# Patient Record
Sex: Female | Born: 1950 | Race: White | Hispanic: No | Marital: Married | Smoking: Never smoker
Health system: Southern US, Community
[De-identification: ages and names within clinical notes are randomized; demographics above are authoritative.]

## PROBLEM LIST (undated history)

## (undated) DIAGNOSIS — D649 Anemia, unspecified: Secondary | ICD-10-CM

## (undated) DIAGNOSIS — I10 Essential (primary) hypertension: Secondary | ICD-10-CM

## (undated) DIAGNOSIS — K219 Gastro-esophageal reflux disease without esophagitis: Secondary | ICD-10-CM

## (undated) DIAGNOSIS — N201 Calculus of ureter: Secondary | ICD-10-CM

## (undated) DIAGNOSIS — I2510 Atherosclerotic heart disease of native coronary artery without angina pectoris: Secondary | ICD-10-CM

## (undated) DIAGNOSIS — K3580 Unspecified acute appendicitis: Secondary | ICD-10-CM

## (undated) DIAGNOSIS — E785 Hyperlipidemia, unspecified: Secondary | ICD-10-CM

## (undated) DIAGNOSIS — Z973 Presence of spectacles and contact lenses: Secondary | ICD-10-CM

## (undated) DIAGNOSIS — E079 Disorder of thyroid, unspecified: Secondary | ICD-10-CM

## (undated) DIAGNOSIS — T7840XA Allergy, unspecified, initial encounter: Secondary | ICD-10-CM

## (undated) HISTORY — DX: Hyperlipidemia, unspecified: E78.5

## (undated) HISTORY — DX: Anemia, unspecified: D64.9

## (undated) HISTORY — DX: Gastro-esophageal reflux disease without esophagitis: K21.9

## (undated) HISTORY — DX: Atherosclerotic heart disease of native coronary artery without angina pectoris: I25.10

## (undated) HISTORY — DX: Disorder of thyroid, unspecified: E07.9

## (undated) HISTORY — DX: Allergy, unspecified, initial encounter: T78.40XA

## (undated) HISTORY — PX: APPENDECTOMY: SHX54

---
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
## 2016-06-11 ENCOUNTER — Ambulatory Visit (INDEPENDENT_AMBULATORY_CARE_PROVIDER_SITE_OTHER): Payer: Medicare Other | Admitting: Internal Medicine

## 2016-06-11 ENCOUNTER — Encounter: Payer: Self-pay | Admitting: Internal Medicine

## 2016-06-11 VITALS — BP 182/82 | HR 65 | Temp 98.8°F | Wt 168.4 lb

## 2016-06-11 DIAGNOSIS — R5383 Other fatigue: Secondary | ICD-10-CM | POA: Insufficient documentation

## 2016-06-11 DIAGNOSIS — R5382 Chronic fatigue, unspecified: Secondary | ICD-10-CM | POA: Diagnosis present

## 2016-06-11 DIAGNOSIS — R030 Elevated blood-pressure reading, without diagnosis of hypertension: Secondary | ICD-10-CM

## 2016-06-11 DIAGNOSIS — I10 Essential (primary) hypertension: Secondary | ICD-10-CM

## 2016-06-11 NOTE — Assessment & Plan Note (Signed)
Initial 182/82, repeat 154/78 after sitting for ~15 minutes. Past dx of HTN 10 years ago prior to significant weight loss. Will begin trial of dietary changes then re-evaluate.  - Provided information regarding DASH diet - Encouraged continued weight loss efforts and exercise - F/u in 6 weeks. If still hypertensive at that time, will consider beginning medication.  - BMP today

## 2016-06-11 NOTE — Progress Notes (Signed)
66 y.o. year old female presents to establish care.  Acute Concerns: Weight: Has gained 40 pounds in past year. Lost 140 pounds 10 years ago and is concerned about regaining weight. Has been working to lose weight, and is a member of [employer]. Exercises 3 days a week with her husband by working at the gym. Is also going to start weight lifting to increase primarily upper body strength.   Elevated BP Patient was diagnosed with hypertension 10 years ago. After she lost 140 pounds, her BP normalized and she was able to stop taking medication. She has not taken medication since that time.   Fatigue Reports frequent fatigue, though thinks this is in part to weight gain. Said she generally felt much better before she gained 40 pounds, which is primary reason why she is trying to lose weight again.   Diet: Well-balanced.   Exercise: Walking 3 days a week.   Sexual/Birth History: [id number] (son died at 55 months of age. Has two living daughters). All vaginal deliveries with only complication of gestational HTN with first pregnancy.   Birth Control: Post-menopausal  Social:  Social History   Social History  . Marital status: Married    Spouse name: Carloyn Manner  . Number of children: N/A  . Years of education: N/A   Occupational History  . retired    Social History Main Topics  . Smoking status: Never Smoker  . Smokeless tobacco: Never Used  . Alcohol use No  . Drug use: No  . Sexual activity: Not Asked   Other Topics Concern  . None   Social History Narrative  . None  No past medical history on file. No Known Allergies History reviewed. No pertinent surgical history.   Immunization:  There is no immunization history on file for this patient.  Cancer Screening:  Pap Smear: 2016  Mammogram: 2016  Colonoscopy: 2013  Physical Exam: VITALS: Reviewed GEN: Pleasant female, NAD HEENT: Normocephalic, PERRL, EOMI, no scleral icterus, bilateral TM pearly  grey, nasal septum midline, MMM, uvula midline, no anterior or posterior lymphadenopathy, no thyromegaly CARDIAC:RRR, S1 and S2 present, no murmur, no heaves/thrills RESP: CTAB, normal effort ABD: soft, no tenderness, normal bowel sounds EXT: No edema, 2+ radial and DP pulses SKIN: no rash  ASSESSMENT & PLAN: 66 y.o. female presents for annual well woman/preventative exam and GYN exam. Please see problem specific assessment and plan.   Elevated BP without diagnosis of hypertension Initial 182/82, repeat 154/78 after sitting for ~15 minutes. Past dx of HTN 10 years ago prior to significant weight loss. Will begin trial of dietary changes then re-evaluate.  - Provided information regarding DASH diet - Encouraged continued weight loss efforts and exercise - F/u in 6 weeks. If still hypertensive at that time, will consider beginning medication.  - BMP today  Fatigue - TSH and CBC today - Will call patient if any abnormal results - F/u in six weeks  Adin Hector, MD, MPH PGY-2 [facility] Pager [phone]

## 2016-06-11 NOTE — Patient Instructions (Signed)
It was nice meeting you today Maria Wall!  Today we checked your thyroid hormone level, your blood count, and you electrolytes (sodium, potassium, etc). I will call you if there are any abnormal results.   Your blood pressure was elevated today. We will begin a trial of dietary changes to try to bring it down. If it is still elevated at your follow-up appointment, we will discuss beginning medication.   I will see you back in six weeks to recheck your blood pressure.   If you have any questions or concerns in the meantime, please feel free to call the clinic.   Be well,  Dr. Avon Gully   DASH Eating Plan DASH stands for "Dietary Approaches to Stop Hypertension." The DASH eating plan is a healthy eating plan that has been shown to reduce high blood pressure (hypertension). It may also reduce your risk for type 2 diabetes, heart disease, and stroke. The DASH eating plan may also help with weight loss. What are tips for following this plan? General guidelines   Avoid eating more than 2,300 mg (milligrams) of salt (sodium) a day. If you have hypertension, you may need to reduce your sodium intake to 1,500 mg a day.  Limit alcohol intake to no more than 1 drink a day for nonpregnant women and 2 drinks a day for men. One drink equals 12 oz of beer, 5 oz of wine, or 1 oz of hard liquor.  Work with your health care provider to maintain a healthy body weight or to lose weight. Ask what an ideal weight is for you.  Get at least 30 minutes of exercise that causes your heart to beat faster (aerobic exercise) most days of the week. Activities may include walking, swimming, or biking.  Work with your health care provider or diet and nutrition specialist (dietitian) to adjust your eating plan to your individual calorie needs. Reading food labels   Check food labels for the amount of sodium per serving. Choose foods with less than 5 percent of the Daily Value of sodium. Generally, foods with less than  300 mg of sodium per serving fit into this eating plan.  To find whole grains, look for the word "whole" as the first word in the ingredient list. Shopping   Buy products labeled as "low-sodium" or "no salt added."  Buy fresh foods. Avoid canned foods and premade or frozen meals. Cooking   Avoid adding salt when cooking. Use salt-free seasonings or herbs instead of table salt or sea salt. Check with your health care provider or pharmacist before using salt substitutes.  Do not fry foods. Cook foods using healthy methods such as baking, boiling, grilling, and broiling instead.  Cook with heart-healthy oils, such as olive, canola, soybean, or sunflower oil. Meal planning    Eat a balanced diet that includes:  5 or more servings of fruits and vegetables each day. At each meal, try to fill half of your plate with fruits and vegetables.  Up to 6-8 servings of whole grains each day.  Less than 6 oz of lean meat, poultry, or fish each day. A 3-oz serving of meat is about the same size as a deck of cards. One egg equals 1 oz.  2 servings of low-fat dairy each day.  A serving of nuts, seeds, or beans 5 times each week.  Heart-healthy fats. Healthy fats called Omega-3 fatty acids are found in foods such as flaxseeds and coldwater fish, like sardines, salmon, and mackerel.  Limit how  much you eat of the following:  Canned or prepackaged foods.  Food that is high in trans fat, such as fried foods.  Food that is high in saturated fat, such as fatty meat.  Sweets, desserts, sugary drinks, and other foods with added sugar.  Full-fat dairy products.  Do not salt foods before eating.  Try to eat at least 2 vegetarian meals each week.  Eat more home-cooked food and less restaurant, buffet, and fast food.  When eating at a restaurant, ask that your food be prepared with less salt or no salt, if possible. What foods are recommended? The items listed may not be a complete list. Talk  with your dietitian about what dietary choices are best for you. Grains  Whole-grain or whole-wheat bread. Whole-grain or whole-wheat pasta. Brown rice. [place] Morrow. Bulgur. Whole-grain and low-sodium cereals. Pita bread. Low-fat, low-sodium crackers. Whole-wheat flour tortillas. Vegetables  Fresh or frozen vegetables (raw, steamed, roasted, or grilled). Low-sodium or reduced-sodium tomato and vegetable juice. Low-sodium or reduced-sodium tomato sauce and tomato paste. Low-sodium or reduced-sodium canned vegetables. Fruits  All fresh, dried, or frozen fruit. Canned fruit in natural juice (without added sugar). Meat and other protein foods  Skinless chicken or [place]. Ground chicken or [place]. Pork with fat trimmed off. Fish and seafood. Egg whites. Dried beans, peas, or lentils. Unsalted nuts, nut butters, and seeds. Unsalted canned beans. Lean cuts of beef with fat trimmed off. Low-sodium, lean deli meat. Dairy  Low-fat (1%) or fat-free (skim) milk. Fat-free, low-fat, or reduced-fat cheeses. Nonfat, low-sodium ricotta or cottage cheese. Low-fat or nonfat yogurt. Low-fat, low-sodium cheese. Fats and oils  Soft margarine without trans fats. Vegetable oil. Low-fat, reduced-fat, or light mayonnaise and salad dressings (reduced-sodium). Canola, safflower, olive, soybean, and sunflower oils. Avocado. Seasoning and other foods  Herbs. Spices. Seasoning mixes without salt. Unsalted popcorn and pretzels. Fat-free sweets. What foods are not recommended? The items listed may not be a complete list. Talk with your dietitian about what dietary choices are best for you. Grains  Baked goods made with fat, such as croissants, muffins, or some breads. Dry pasta or rice meal packs. Vegetables  Creamed or fried vegetables. Vegetables in a cheese sauce. Regular canned vegetables (not low-sodium or reduced-sodium). Regular canned tomato sauce and paste (not low-sodium or reduced-sodium). Regular tomato and  vegetable juice (not low-sodium or reduced-sodium). [name]. Olives. Fruits  Canned fruit in a light or heavy syrup. Fried fruit. Fruit in cream or butter sauce. Meat and other protein foods  Fatty cuts of meat. Ribs. Fried meat. Berniece Salines. Sausage. Bologna and other processed lunch meats. Salami. Fatback. Hotdogs. Bratwurst. Salted nuts and seeds. Canned beans with added salt. Canned or smoked fish. Whole eggs or egg yolks. Chicken or [place] with skin. Dairy  Whole or 2% milk, cream, and half-and-half. Whole or full-fat cream cheese. Whole-fat or sweetened yogurt. Full-fat cheese. Nondairy creamers. Whipped toppings. Processed cheese and cheese spreads. Fats and oils  Butter. Stick margarine. Lard. Shortening. Ghee. Bacon fat. Tropical oils, such as coconut, palm kernel, or palm oil. Seasoning and other foods  Salted popcorn and pretzels. Onion salt, garlic salt, seasoned salt, table salt, and sea salt. Worcestershire sauce. Tartar sauce. Barbecue sauce. Teriyaki sauce. Soy sauce, including reduced-sodium. Steak sauce. Canned and packaged gravies. Fish sauce. Oyster sauce. Cocktail sauce. Horseradish that you find on the shelf. Ketchup. Mustard. Meat flavorings and tenderizers. Bouillon cubes. Hot sauce and Tabasco sauce. Premade or packaged marinades. Premade or packaged taco seasonings. Relishes. Regular salad  dressings. Where to find more information:  National Heart, Lung, and [place]: https://wilson-eaton.com/  American Heart Association: www.heart.org Summary  The DASH eating plan is a healthy eating plan that has been shown to reduce high blood pressure (hypertension). It may also reduce your risk for type 2 diabetes, heart disease, and stroke.  With the DASH eating plan, you should limit salt (sodium) intake to 2,300 mg a day. If you have hypertension, you may need to reduce your sodium intake to 1,500 mg a day.  When on the DASH eating plan, aim to eat more fresh fruits and vegetables,  whole grains, lean proteins, low-fat dairy, and heart-healthy fats.  Work with your health care provider or diet and nutrition specialist (dietitian) to adjust your eating plan to your individual calorie needs. This information is not intended to replace advice given to you by your health care provider. Make sure you discuss any questions you have with your health care provider. Document Released: 01/07/2011 Document Revised: 01/12/2016 Document Reviewed: 01/12/2016 Elsevier Interactive Patient Education  2017 [employer].

## 2016-06-11 NOTE — Assessment & Plan Note (Signed)
-   TSH and CBC today - Will call patient if any abnormal results - F/u in six weeks

## 2016-06-12 LAB — BASIC METABOLIC PANEL
BUN / CREAT RATIO: 23 (ref 12–28)
BUN: 14 mg/dL (ref 8–27)
CHLORIDE: 101 mmol/L (ref 96–106)
CO2: 21 mmol/L (ref 18–29)
Calcium: 12.1 mg/dL — ABNORMAL HIGH (ref 8.7–10.3)
Creatinine, Ser: 0.6 mg/dL (ref 0.57–1.00)
GFR calc Af Amer: 110 mL/min/{1.73_m2} (ref >59)
GFR calc non Af Amer: 95 mL/min/{1.73_m2} (ref >59)
Glucose: 86 mg/dL (ref 65–99)
Potassium: 4.3 mmol/L (ref 3.5–5.2)
Sodium: 140 mmol/L (ref 134–144)

## 2016-06-12 LAB — CBC
HEMATOCRIT: 40.6 % (ref 34.0–46.6)
Hemoglobin: 13.5 g/dL (ref 11.1–15.9)
MCH: 29.8 pg (ref 26.6–33.0)
MCHC: 33.3 g/dL (ref 31.5–35.7)
MCV: 90 fL (ref 79–97)
Platelets: 235 10*3/uL (ref 150–379)
RBC: 4.53 x10E6/uL (ref 3.77–5.28)
RDW: 13.8 % (ref 12.3–15.4)
WBC: 5.6 10*3/uL (ref 3.4–10.8)

## 2016-06-12 LAB — TSH: TSH: 1.82 u[IU]/mL (ref 0.450–4.500)

## 2016-07-22 ENCOUNTER — Encounter: Payer: Self-pay | Admitting: Internal Medicine

## 2016-07-22 ENCOUNTER — Ambulatory Visit (INDEPENDENT_AMBULATORY_CARE_PROVIDER_SITE_OTHER): Payer: Medicare Other | Admitting: Internal Medicine

## 2016-07-22 DIAGNOSIS — Z713 Dietary counseling and surveillance: Secondary | ICD-10-CM | POA: Diagnosis not present

## 2016-07-22 DIAGNOSIS — I10 Essential (primary) hypertension: Secondary | ICD-10-CM

## 2016-07-22 DIAGNOSIS — Z0000 Encounter for general adult medical examination without abnormal findings: Secondary | ICD-10-CM | POA: Insufficient documentation

## 2016-07-22 MED ORDER — HYDROCORTISONE 2.5 % EX OINT: TOPICAL_OINTMENT | Freq: Two times a day (BID) | CUTANEOUS | 3 refills | Status: DC

## 2016-07-22 MED ORDER — AMLODIPINE BESYLATE 5 MG PO TABS: 5.0000 mg | ORAL_TABLET | Freq: Every day | ORAL | 0 refills | Status: DC

## 2016-07-22 NOTE — Progress Notes (Signed)
   Subjective:   Patient: Maria Wall       Birthdate: [birth year]       MRN: [id number]      HPI  Maria Wall is a 66 y.o. female presenting for BP f/u as well as concerns about weight.   BP Patient noted to be hypertensive at last visit. Had prior diagnosis of HTN treated with a medication >10 years ago which resolved after patient lost ~40 pounds. She cannot remember which medication she was taking. Has tried to adhere to DASH diet in past six weeks since last visit. Denies headaches or changes in vision.   Weight concerns Patient frustrated that she is unable to lose weight despite healthy diet and exercise. Formerly weighed significantly more, then joined [employer], a 12 step program, and lost ~40 pounds. Is still attending OA meetings. Eats a very regimented well-balanced diet - 4 oz of protein at lunch and dinner, salad with 2 tsp dressing at lunch and dinner, 6 oz fruit daily, breakfast consisting of yogurt, eggs, or oatmeal. Measures all food to ensure correct portion size. Eats out approximately 1-2 times per week. Weight fluctuates between 163-168 pounds. Has been walking about 3 miles 2-3 times per week, but has had to decrease recently due to back pain. Most recently has been walking on treadmill rather than outside without pain. Also lifts weights at gym.   Smoking status reviewed. Patient is never smoker.   Review of Systems See HPI.     Objective:  Physical Exam  Constitutional: She is oriented to person, place, and time and well-developed, well-nourished, and in no distress.  HENT:  Head: Normocephalic and atraumatic.  Mouth/Throat: No oropharyngeal exudate.  Eyes: Conjunctivae and EOM are normal. Right eye exhibits no discharge. Left eye exhibits no discharge.  Pulmonary/Chest: Effort normal. No respiratory distress.  Musculoskeletal: She exhibits no edema or tenderness.  Neurological: She is alert and oriented to person, place, and time.  Skin: Skin is warm and  dry. No rash noted.  Psychiatric: Affect and judgment normal.      Assessment & Plan:  Essential hypertension BP 172/86 initially, 170/80 on recheck about 10 minutes later. No improvement in BP after 6 weeks of DASH diet. Given patient's very regimented diet, doubt dietary changes will make significant enough change to prevent beginning anti-hypertensive. Patient agreeable to starting anti-hypertensive today. BMP checked at last visit and Cr WNL.  - Begin amlodipine 5mg  qd - F/u in 6 weeks. Can increase to 10mg  qd if needed at that time.    Weight loss counseling, encounter for Patient frustrated with lack of weight loss despite exercise and very regimented diet. TSH WNL at last visit, so hypothyroidism not likely etiology. Discussed with Dr. Jenne Campus, who felt that meeting with patient would be beneficial. Provided patient with Dr. Jenne Campus phone number and encouraged to call at earliest convenience.   Healthcare maintenance Patient due for pap and mammogram.  - Provided information for mammogram. Patient to schedule.  - Pap to be performed at next visit in 6 weeks   Adin Hector, MD, MPH PGY-2 [facility] Pager [phone]

## 2016-07-22 NOTE — Assessment & Plan Note (Signed)
Patient frustrated with lack of weight loss despite exercise and very regimented diet. TSH WNL at last visit, so hypothyroidism not likely etiology. Discussed with Dr. Jenne Campus, who felt that meeting with patient would be beneficial. Provided patient with Dr. Jenne Campus phone number and encouraged to call at earliest convenience.

## 2016-07-22 NOTE — Assessment & Plan Note (Signed)
BP 172/86 initially, 170/80 on recheck about 10 minutes later. No improvement in BP after 6 weeks of DASH diet. Given patient's very regimented diet, doubt dietary changes will make significant enough change to prevent beginning anti-hypertensive. Patient agreeable to starting anti-hypertensive today. BMP checked at last visit and Cr WNL.  - Begin amlodipine 5mg  qd - F/u in 6 weeks. Can increase to 10mg  qd if needed at that time.

## 2016-07-22 NOTE — Assessment & Plan Note (Signed)
Patient due for pap and mammogram.  - Provided information for mammogram. Patient to schedule.  - Pap to be performed at next visit in 6 weeks

## 2016-07-22 NOTE — Patient Instructions (Addendum)
It was nice seeing you again today Mrs. Woodlawn!  Please begin taking amlodipine (Norvasc) 5mg  for your blood pressure. Take one tablet daily.   Please call Dr. Jenne Campus, our nutritionist, at your earliest convenience to schedule an appointment to talk about weight loss. Her phone number is [phone].   I will see you back in six weeks to follow-up on your blood pressure and for your pap smear. Please also schedule your mammogram at your earliest convenience.   If you have any questions or concerns, please feel free to call the clinic.   Be well,  Dr. Avon Gully

## 2016-07-26 ENCOUNTER — Other Ambulatory Visit: Payer: Self-pay | Admitting: Internal Medicine

## 2016-07-26 DIAGNOSIS — Z1231 Encounter for screening mammogram for malignant neoplasm of breast: Secondary | ICD-10-CM

## 2016-08-23 ENCOUNTER — Ambulatory Visit: Payer: PRIVATE HEALTH INSURANCE

## 2016-09-03 ENCOUNTER — Encounter: Payer: Self-pay | Admitting: Internal Medicine

## 2016-09-03 ENCOUNTER — Other Ambulatory Visit (HOSPITAL_COMMUNITY)
Admission: RE | Admit: 2016-09-03 | Discharge: 2016-09-03 | Disposition: A | Discharge status: Home / Self Care | Payer: Medicare Other | Source: Ambulatory Visit | Attending: Family Medicine | Admitting: Family Medicine

## 2016-09-03 ENCOUNTER — Ambulatory Visit (INDEPENDENT_AMBULATORY_CARE_PROVIDER_SITE_OTHER): Payer: Medicare Other | Admitting: Internal Medicine

## 2016-09-03 VITALS — BP 124/62 | HR 61 | Temp 98.4°F | Ht 64.5 in | Wt 171.0 lb

## 2016-09-03 DIAGNOSIS — I10 Essential (primary) hypertension: Secondary | ICD-10-CM | POA: Diagnosis present

## 2016-09-03 DIAGNOSIS — Z124 Encounter for screening for malignant neoplasm of cervix: Secondary | ICD-10-CM | POA: Diagnosis present

## 2016-09-03 NOTE — Assessment & Plan Note (Signed)
Has strayed from diet while on vacation this summer, but is eager to return to healthier eating plan. Has appt with Dr. Jenne Campus in about two weeks.  - Keep appt with Dr. Jenne Campus on Aug 16

## 2016-09-03 NOTE — Addendum Note (Signed)
Addended by: Junious Dresser on: 09/03/2016 11:06 AM   Modules accepted: Orders

## 2016-09-03 NOTE — Patient Instructions (Signed)
It was nice seeing you again today Ms. Teton!  I will let you know if there are any abnormalities with your pap smear.   Please continue taking your medications as you have been.   If you have any questions or concerns, please feel free to call the clinic.   Be well,  Dr. Avon Gully

## 2016-09-03 NOTE — Assessment & Plan Note (Addendum)
BP initially elevated at 150/72 when measured directly after arriving to clinic. Improved after pap smear and after sitting in room for a while.  - Continue current treatment regimen of Norvasc 5mg 

## 2016-09-03 NOTE — Progress Notes (Signed)
   Subjective:   Patient: Maria Wall       Birthdate: 09/28/1950       MRN: [id number]      HPI  Maria Wall is a 66 y.o. female presenting for f/u of HTN, weight management, and for pap smear.   HTN Patient started on 5mg  Norvasc at last visit six weeks ago, after her BP did not improve with 6 weeks of DASH diet. Has been taking this daily without reported side effects. Denies HA, changes in vision. Does not check her BP at home.   Weight management Discussed patient's frustration with her weight at last visit. She was to schedule appt with Dr. Jenne Campus. Patient has appt with Dr Jenne Campus on Aug 16. Says that since last visit she hasn't made much improvement in her weight, primarily because she has been on vacation. She has been with her grandchildren and says it is difficult to eat healthy around them. She resumed her regimented eating plan yesterday after getting back from vacation and intends to continue eating healthier. She is looking forward to her appt with Dr. Jenne Campus.   Smoking status reviewed. Patient is never smoker.   Review of Systems See HPI.     Objective:  Physical Exam  Constitutional: She is oriented to person, place, and time and well-developed, well-nourished, and in no distress.  HENT:  Head: Normocephalic and atraumatic.  Eyes: Pupils are equal, round, and reactive to light. Conjunctivae and EOM are normal. Right eye exhibits no discharge. Left eye exhibits no discharge.  Pulmonary/Chest: Effort normal. No respiratory distress.  Genitourinary: Vagina normal, uterus normal, cervix normal, right adnexa normal and left adnexa normal. No vaginal discharge found.  Neurological: She is alert and oriented to person, place, and time.  Skin: Skin is warm and dry.  Psychiatric: Affect and judgment normal.      Assessment & Plan:  Essential hypertension BP initially elevated at 150/72 when measured directly after arriving to clinic. Improved after pap smear and after sitting in  room for a while.  - Continue current treatment regimen of Norvasc 5mg   Weight loss counseling, encounter for Has strayed from diet while on vacation this summer, but is eager to return to healthier eating plan. Has appt with Dr. Jenne Campus in about two weeks.  - Keep appt with Dr. Jenne Campus on Aug 16  Healthcare maintenance Pap smear performed today. Will alert patient of any abnormal results.    Adin Hector, MD, MPH PGY-3 [facility] Pager [phone]

## 2016-09-03 NOTE — Assessment & Plan Note (Signed)
Pap smear performed today. Will alert patient of any abnormal results.

## 2016-09-06 LAB — CYTOLOGY - PAP
DIAGNOSIS: NEGATIVE
HPV: NOT DETECTED

## 2016-09-13 ENCOUNTER — Ambulatory Visit: Payer: PRIVATE HEALTH INSURANCE | Admitting: Family Medicine

## 2016-09-16 ENCOUNTER — Ambulatory Visit (INDEPENDENT_AMBULATORY_CARE_PROVIDER_SITE_OTHER): Payer: Medicare Other | Admitting: Family Medicine

## 2016-09-16 ENCOUNTER — Encounter: Payer: Self-pay | Admitting: Family Medicine

## 2016-09-16 VITALS — Ht 64.5 in | Wt 173.6 lb

## 2016-09-16 DIAGNOSIS — I10 Essential (primary) hypertension: Secondary | ICD-10-CM | POA: Diagnosis present

## 2016-09-16 DIAGNOSIS — Z713 Dietary counseling and surveillance: Secondary | ICD-10-CM

## 2016-09-16 NOTE — Progress Notes (Signed)
  Assessment:  Primary concerns today: Weight management and blood pressure management.  Patient has lost 140 lbs beginning 10 y ago and was able to maintain a weight of 140-150 lbs until her recent move to [place]. 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 Patient was recently started on BP medication following 40 lb weight gain despite continued effort at weight loss. Patient became tearful when we discussed her recent move to [place] which she believes has played a role in weight gain.   Learning Readiness:   Change in progress  Usual eating pattern includes 3 meals and 0-1 snack per day. Frequent foods and beverages include lean meats, vegetables, sweet tea( stevia) Vitamin water diet,.   Usual physical activity includes walking (cardio) 1hr two-three times a week.  24-hr recall: (Up at 6 AM) B (8 AM)-  6 oz yogurt, 6 oz blueberries, 1 oz oatmeal (1/4 cup), tablespoon of coconut oil, cup of coffee, 2 packs of stevia, cream (2 teaspoons) Snk ( AM)- ---  L (115PM)-  Shrimp salad, vinegrette w/ cheese (1/8 cups), water Snk ( 330)-  Apple   D (630)-  Grilled chicken thighs 4oz, 6 oz onions & zucc, salad 6oz  (cucumber, tomatoes, avocados 2 tbsp, dressing lite dressing (40 cal)  Snk ( PM)-  --- Typical day? Yes.      Handouts given during visit include:  AVS Demonstrated degree of understanding via:  Teach Back  Barriers to learning/adherence to lifestyle change: building social support (move from [place] <2 y ago)  Monitoring/Evaluation:  Dietary intake, exercise, and body weight 8 weeks (11/18/2016 @9 :30 am)

## 2016-09-16 NOTE — Patient Instructions (Addendum)
It was great seeing you today! We have addressed the following issues today  Goals: 1. Cardio exercise: 1 hr , 4 days a week..  2. Strength exercise: 20 min, 4 days a week. Document number of minutes of exercise and weight progression. 3. Increase protein intake to 6 oz meat, fish, poultry for lunch and dinner.  4. [employer]: search for living well: managing emotional eating.  [employer]: [address], [place], [place] [address]  If we did any lab work today, and the results require attention, either me or my nurse will get in touch with you. If everything is normal, you will get a letter in mail and a message via . If you don't hear from [place] in two weeks, please give [place] a call. Otherwise, we look forward to seeing you again at your next visit. If you have any questions or concerns before then, please call the clinic at [phone].  Please bring all your medications to every doctors visit  Sign up for My Chart to have easy access to your labs results, and communication with your Primary care physician. Please ask Front Desk for some assistance.   Please check-out at the front desk before leaving the clinic.    Take Care,   Dr. Burman Nieves

## 2016-09-17 ENCOUNTER — Ambulatory Visit
Admission: RE | Admit: 2016-09-17 | Discharge: 2016-09-17 | Disposition: A | Discharge status: Home / Self Care | Payer: Medicare Other | Source: Ambulatory Visit | Attending: Family Medicine | Admitting: Family Medicine

## 2016-09-17 DIAGNOSIS — Z1231 Encounter for screening mammogram for malignant neoplasm of breast: Secondary | ICD-10-CM

## 2016-09-28 ENCOUNTER — Other Ambulatory Visit: Payer: Self-pay | Admitting: Internal Medicine

## 2016-11-18 ENCOUNTER — Ambulatory Visit: Payer: PRIVATE HEALTH INSURANCE | Admitting: Family Medicine

## 2016-12-09 ENCOUNTER — Ambulatory Visit: Payer: PRIVATE HEALTH INSURANCE | Admitting: Family Medicine

## 2016-12-16 ENCOUNTER — Ambulatory Visit (INDEPENDENT_AMBULATORY_CARE_PROVIDER_SITE_OTHER): Payer: Medicare Other | Admitting: Student

## 2016-12-16 VITALS — Ht 64.5 in | Wt 175.0 lb

## 2016-12-16 DIAGNOSIS — Z713 Dietary counseling and surveillance: Secondary | ICD-10-CM

## 2016-12-16 NOTE — Patient Instructions (Addendum)
It was great seeing you today! We have set up the following goals: 1. Cardio exercise: 1 hr , 4 days a week..  2. Strength exercise: 20 min, 4 days a week. Document number of minutes of exercise and weight progression. 3. You would like to try Keto diet. Please update [place] with the progress with this. You may e-mail Dr. Jenne Campus  4. In regards to your fluid intake, you may start the day with 16 oz and try to meet your daily need during the first half of the day 5. Sleep: discuss about sleep apnea with your Husband. He may need a formal evaluation.  6. [employer]: search for living well: managing emotional eating.   Sleep Apnea Sleep apnea is a condition that affects breathing. People with sleep apnea have moments during sleep when their breathing pauses briefly or gets shallow. Sleep apnea can cause these symptoms:  Trouble staying asleep.  Sleepiness or tiredness during the day.  Irritability.  Loud snoring.  Morning headaches.  Trouble concentrating.  Forgetting things.  Less interest in sex.  Being sleepy for no reason.  Mood swings.  Personality changes.  Depression.  Waking up a lot during the night to pee (urinate).  Dry mouth.  Sore throat.  Follow these instructions at home:  Make any changes in your routine that your doctor recommends.  Eat a healthy, well-balanced diet.  Take over-the-counter and prescription medicines only as told by your doctor.  Avoid using alcohol, calming medicines (sedatives), and narcotic medicines.  Take steps to lose weight if you are overweight.  If you were given a machine (device) to use while you sleep, use it only as told by your doctor.  Do not use any tobacco products, such as cigarettes, chewing tobacco, and e-cigarettes. If you need help quitting, ask your doctor.  Keep all follow-up visits as told by your doctor. This is important. Contact a doctor if:  The machine that you were given to use during  sleep is uncomfortable or does not seem to be working.  Your symptoms do not get better.  Your symptoms get worse. Get help right away if:  Your chest hurts.  You have trouble breathing in enough air (shortness of breath).  You have an uncomfortable feeling in your back, arms, or stomach.  You have trouble talking.  One side of your body feels weak.  A part of your face is hanging down (drooping). These symptoms may be an emergency. Do not wait to see if the symptoms will go away. Get medical help right away. Call your local emergency services (911 in the [place] Do not drive yourself to the hospital. This information is not intended to replace advice given to you by your health care provider. Make sure you discuss any questions you have with your health care provider. Document Released: 10/28/2007 Document Revised: 09/14/2015 Document Reviewed: 10/28/2014 Elsevier Interactive Patient Education  [employer].

## 2016-12-16 NOTE — Progress Notes (Signed)
Primary concerns today: Weight management.  Patient presented to nutrition clinic in a good spirits.  She was last seen in the same clinic about 3 months ago.  At that time, her goals were cardio exercise for  1 hr , 4 days a week, strength exercise for 20 min, 4 days a week and increasing protein intake to 6 oz meat, fish, poultry for lunch and dinner. Today, she reports lack of good sleep due to husbands snoring. She also wakes up at night for urination. In regards to exercise, she feels she did well for the months of October. However, she fell off track when her sister arrived to visit from [place] about 2 weeks ago.   In terms of diet, she reports getting 4-6 hours of proteins with lunch and dinner.  She tracks her food intake but doesn't seem to get feedback.   (Up at 6:30 AM) B (8 AM)- egg, an ounce of cheese, 1.5 oz shredded wheat , 3 oz of veg (corn, beans), 3 oz fruits. Water and cup of coffee with stevia Snk ( AM)- none L (1:15 PM)- [place] chicken salad, cheese, olive, half tomato, lettuce and water. Reports using vinegar, lemon and a tea spoonful on honey in her water Snk (3  PM)- part of yellow pepper and pumpkin seeds  D (6 PM)- 5 oz chicken (marinated with [place] dressing), 6 oz of zucchini and onion, 6 oz salad (3 oz of avocado), some cucumbers, 2 tbs of blue cheese. Water. 0 Calorie lemonade Snk ( PM)- none Typical day? Lunch and dinner are typical  Recent physical activity includes .  Progress Towards Goal(s):  Some progress.     Intervention/New goal:  1. Cardio exercise: 1 hr , 4 days a week..  2. Strength exercise: 20 min, 4 days a week. Document number of minutes of exercise and weight progression. 3. You would like to try Keto diet. Please update [place] with the progress with this. You may e-mail Dr. Jenne Campus  4. In regards to your fluid intake, try to meet your daily fluid need during the first half of the day. 5. Sleep: discuss about sleep apnea with your Husband. He may  need a formal evaluation.   6. [email or web address]: search for living well: managing emotional eating.  Patient was recommended to try the quiz on the four tendencies.   Barriers to learning/adherence to lifestyle change: none   Demonstrated degree of understanding via:  Teach Back  Monitoring/Evaluation:  Dietary intake, exercise, and body weight in 2 month(s).

## 2017-02-17 ENCOUNTER — Ambulatory Visit: Payer: Medicare Other | Admitting: Family Medicine

## 2017-09-30 ENCOUNTER — Ambulatory Visit (INDEPENDENT_AMBULATORY_CARE_PROVIDER_SITE_OTHER): Payer: Medicare Other | Admitting: Family Medicine

## 2017-09-30 ENCOUNTER — Encounter: Payer: Self-pay | Admitting: Family Medicine

## 2017-09-30 VITALS — BP 132/68 | HR 65 | Temp 98.5°F | Ht 64.5 in | Wt 169.6 lb

## 2017-09-30 DIAGNOSIS — I10 Essential (primary) hypertension: Secondary | ICD-10-CM | POA: Diagnosis present

## 2017-09-30 DIAGNOSIS — Z0000 Encounter for general adult medical examination without abnormal findings: Secondary | ICD-10-CM

## 2017-09-30 LAB — POCT GLYCOSYLATED HEMOGLOBIN (HGB A1C): Hemoglobin A1C: 4.9 % (ref 4.0–5.6)

## 2017-09-30 LAB — POCT HEMOGLOBIN: Hemoglobin: 13.7 g/dL (ref 12.2–16.2)

## 2017-09-30 MED ORDER — AMLODIPINE BESYLATE 5 MG PO TABS: 5.0000 mg | ORAL_TABLET | Freq: Every day | ORAL | 1 refills | Status: DC

## 2017-09-30 NOTE — Patient Instructions (Addendum)
It was great meeting you today! I refilled your blood pressure medication. We discussed some health maintenance items. I think the major one you are lacking at this point is your pneumovax. It is recommended for adults over 65. Your other missing item is a bone density scan. This is recommended for osteoporosis screening in females above 60.  Everything else looks really good. I will get a cholesterol panel and hemoglobin a1c to screen for diabetes. I will call you with these results.

## 2017-10-01 LAB — LIPID PANEL
CHOL/HDL RATIO: 2.8 ratio (ref 0.0–4.4)
CHOLESTEROL TOTAL: 190 mg/dL (ref 100–199)
HDL: 67 mg/dL (ref >39)
LDL CALC: 103 mg/dL — AB (ref 0–99)
TRIGLYCERIDES: 102 mg/dL (ref 0–149)
VLDL Cholesterol Cal: 20 mg/dL (ref 5–40)

## 2017-10-03 ENCOUNTER — Encounter: Payer: Self-pay | Admitting: Family Medicine

## 2017-10-03 NOTE — Assessment & Plan Note (Signed)
Known whitecoat hypertension, improved with time.  Continue amlodipine 5 mg daily.

## 2017-10-03 NOTE — Progress Notes (Signed)
   HPI 67 year old who just presents for medication refills.  She only takes amlodipine 5 mg daily, and hydrocortisone 2.5% ointment.  Patient's blood pressure on initial arrival 148/72.  States that she notoriously has whitecoat hypertension.  Recheck around 25 minutes later 132/68.   Discussed health maintenance items, he is overdue for several hep C Tdap colonoscopy DEXA scan Pneumovax and influenza.  She moved here from [place] couple years ago, she states she had most of these done in [place].  She called him a rescue nail couple years ago had to get a tetanus vaccine for that.  He also had a colonoscopy within the last 3 to 4 years.  Provided information on hepatitis C DEXA scan and Pneumovax.  She is going to think about these and schedule an appointment if she wants them done.  Asked patient to please try and acquire these records so we can update our system, cannot mark them as completed unless we have documentation.  Is also been quite sometime since last lipid panel and hemoglobin A 1C.  We will recheck.  CC: Medication refills   ROS:   Review of Systems See HPI for ROS.   CC, SH/smoking status, and VS noted  Objective: BP 132/68   Pulse 65   Temp 98.5 F (36.9 C) (Oral)   Ht 5' 4.5" (1.638 m)   Wt 169 lb 9.6 oz (76.9 kg)   SpO2 99%   BMI 28.66 kg/m  Gen: NAD, alert, cooperative, and pleasant. Pleasant Caucasian female.  HEENT: NCAT, EOMI, PERRL CV: RRR, no murmur Resp: CTAB, no wheezes, non-labored Abd: SNTND, BS present, no guarding or organomegaly Ext: No edema, warm Neuro: Alert and oriented, Speech clear, No gross deficits   Assessment and plan:  Essential hypertension Known whitecoat hypertension, improved with time.  Continue amlodipine 5 mg daily.  Healthcare maintenance Discussed getting several health maintenance items done.  Patient to think about these and make appointment if she wants them.  Also has had some of these done in [place] prior  to moving, asked patient to try and get this documentation.  Will check A1c and lipid panel, will call patient if abnormal results.   Orders Placed This Encounter  Procedures  . Lipid Panel  . POCT hemoglobin    Associate with Z13.0  . POCT HgB A1C (CPT 83036)    Meds ordered this encounter  Medications  . amLODipine (NORVASC) 5 MG tablet    Sig: Take 1 tablet (5 mg total) by mouth daily.    Dispense:  90 tablet    Refill:  1     Guadalupe Dawn MD PGY-2 Family Medicine Resident  10/03/2017 8:13 PM

## 2017-10-03 NOTE — Assessment & Plan Note (Signed)
Discussed getting several health maintenance items done.  Patient to think about these and make appointment if she wants them.  Also has had some of these done in [place] prior to moving, asked patient to try and get this documentation.  Will check A1c and lipid panel, will call patient if abnormal results.

## 2018-05-03 ENCOUNTER — Encounter: Payer: Medicare Other | Admitting: Family Medicine

## 2018-05-04 ENCOUNTER — Other Ambulatory Visit: Payer: Self-pay | Admitting: Family Medicine

## 2018-07-07 ENCOUNTER — Encounter (HOSPITAL_COMMUNITY)
Admission: EM | Disposition: A | Discharge status: Home / Self Care | Payer: Self-pay | Source: Home / Self Care | Attending: Emergency Medicine

## 2018-07-07 ENCOUNTER — Emergency Department (HOSPITAL_COMMUNITY): Payer: Medicare Other | Admitting: Certified Registered Nurse Anesthetist

## 2018-07-07 ENCOUNTER — Observation Stay (HOSPITAL_COMMUNITY)
Admission: EM | Admit: 2018-07-07 | Discharge: 2018-07-08 | Disposition: A | Discharge status: Home / Self Care | Payer: Medicare Other | Attending: Surgery | Admitting: Surgery

## 2018-07-07 ENCOUNTER — Encounter (HOSPITAL_COMMUNITY): Payer: Self-pay

## 2018-07-07 ENCOUNTER — Telehealth: Payer: Self-pay | Admitting: Family Medicine

## 2018-07-07 ENCOUNTER — Other Ambulatory Visit: Payer: Self-pay

## 2018-07-07 ENCOUNTER — Encounter: Payer: Self-pay | Admitting: Family Medicine

## 2018-07-07 ENCOUNTER — Ambulatory Visit (INDEPENDENT_AMBULATORY_CARE_PROVIDER_SITE_OTHER): Payer: Medicare Other | Admitting: Family Medicine

## 2018-07-07 ENCOUNTER — Ambulatory Visit (HOSPITAL_COMMUNITY)
Admission: RE | Admit: 2018-07-07 | Discharge: 2018-07-07 | Disposition: A | Discharge status: Home / Self Care | Payer: Medicare Other | Source: Ambulatory Visit | Attending: Family Medicine | Admitting: Family Medicine

## 2018-07-07 DIAGNOSIS — Z87442 Personal history of urinary calculi: Secondary | ICD-10-CM | POA: Insufficient documentation

## 2018-07-07 DIAGNOSIS — N201 Calculus of ureter: Secondary | ICD-10-CM

## 2018-07-07 DIAGNOSIS — Z791 Long term (current) use of non-steroidal anti-inflammatories (NSAID): Secondary | ICD-10-CM | POA: Insufficient documentation

## 2018-07-07 DIAGNOSIS — D259 Leiomyoma of uterus, unspecified: Secondary | ICD-10-CM | POA: Diagnosis not present

## 2018-07-07 DIAGNOSIS — I700 Atherosclerosis of aorta: Secondary | ICD-10-CM | POA: Diagnosis not present

## 2018-07-07 DIAGNOSIS — Z825 Family history of asthma and other chronic lower respiratory diseases: Secondary | ICD-10-CM | POA: Diagnosis not present

## 2018-07-07 DIAGNOSIS — Z1159 Encounter for screening for other viral diseases: Secondary | ICD-10-CM | POA: Insufficient documentation

## 2018-07-07 DIAGNOSIS — I10 Essential (primary) hypertension: Secondary | ICD-10-CM | POA: Diagnosis not present

## 2018-07-07 DIAGNOSIS — Z8262 Family history of osteoporosis: Secondary | ICD-10-CM | POA: Diagnosis not present

## 2018-07-07 DIAGNOSIS — K3580 Unspecified acute appendicitis: Secondary | ICD-10-CM | POA: Diagnosis present

## 2018-07-07 DIAGNOSIS — R1031 Right lower quadrant pain: Secondary | ICD-10-CM | POA: Insufficient documentation

## 2018-07-07 DIAGNOSIS — Z820 Family history of epilepsy and other diseases of the nervous system: Secondary | ICD-10-CM | POA: Insufficient documentation

## 2018-07-07 DIAGNOSIS — Z823 Family history of stroke: Secondary | ICD-10-CM | POA: Diagnosis not present

## 2018-07-07 DIAGNOSIS — Z79899 Other long term (current) drug therapy: Secondary | ICD-10-CM | POA: Diagnosis not present

## 2018-07-07 DIAGNOSIS — K7689 Other specified diseases of liver: Secondary | ICD-10-CM | POA: Insufficient documentation

## 2018-07-07 DIAGNOSIS — N132 Hydronephrosis with renal and ureteral calculous obstruction: Secondary | ICD-10-CM | POA: Insufficient documentation

## 2018-07-07 DIAGNOSIS — Z818 Family history of other mental and behavioral disorders: Secondary | ICD-10-CM | POA: Insufficient documentation

## 2018-07-07 DIAGNOSIS — Z8249 Family history of ischemic heart disease and other diseases of the circulatory system: Secondary | ICD-10-CM | POA: Insufficient documentation

## 2018-07-07 HISTORY — PX: LAPAROSCOPIC APPENDECTOMY: SHX408

## 2018-07-07 HISTORY — DX: Unspecified acute appendicitis: K35.80

## 2018-07-07 LAB — CBC WITH DIFFERENTIAL/PLATELET
Abs Immature Granulocytes: 0.04 10*3/uL (ref 0.00–0.07)
Basophils Absolute: 0 10*3/uL (ref 0.0–0.1)
Basophils Relative: 0 %
Eosinophils Absolute: 0.1 10*3/uL (ref 0.0–0.5)
Eosinophils Relative: 1 %
HCT: 44.4 % (ref 36.0–46.0)
Hemoglobin: 14.4 g/dL (ref 12.0–15.0)
Immature Granulocytes: 0 %
Lymphocytes Relative: 14 %
Lymphs Abs: 1.4 10*3/uL (ref 0.7–4.0)
MCH: 30 pg (ref 26.0–34.0)
MCHC: 32.4 g/dL (ref 30.0–36.0)
MCV: 92.5 fL (ref 80.0–100.0)
Monocytes Absolute: 0.7 10*3/uL (ref 0.1–1.0)
Monocytes Relative: 7 %
Neutro Abs: 7.6 10*3/uL (ref 1.7–7.7)
Neutrophils Relative %: 78 %
Platelets: 199 10*3/uL (ref 150–400)
RBC: 4.8 MIL/uL (ref 3.87–5.11)
RDW: 13.1 % (ref 11.5–15.5)
WBC: 9.8 10*3/uL (ref 4.0–10.5)
nRBC: 0 % (ref 0.0–0.2)

## 2018-07-07 LAB — COMPREHENSIVE METABOLIC PANEL
ALT: 21 U/L (ref 0–44)
AST: 29 U/L (ref 15–41)
Albumin: 4.6 g/dL (ref 3.5–5.0)
Alkaline Phosphatase: 73 U/L (ref 38–126)
Anion gap: 9 (ref 5–15)
BUN: 8 mg/dL (ref 8–23)
CO2: 24 mmol/L (ref 22–32)
Calcium: 11.4 mg/dL — ABNORMAL HIGH (ref 8.9–10.3)
Chloride: 102 mmol/L (ref 98–111)
Creatinine, Ser: 0.55 mg/dL (ref 0.44–1.00)
GFR calc Af Amer: >60 mL/min (ref >60)
GFR calc non Af Amer: >60 mL/min (ref >60)
Glucose, Bld: 95 mg/dL (ref 70–99)
Potassium: 3.7 mmol/L (ref 3.5–5.1)
Sodium: 135 mmol/L (ref 135–145)
Total Bilirubin: 0.7 mg/dL (ref 0.3–1.2)
Total Protein: 7.8 g/dL (ref 6.5–8.1)

## 2018-07-07 LAB — SARS CORONAVIRUS 2 BY RT PCR (HOSPITAL ORDER, PERFORMED IN ~~LOC~~ HOSPITAL LAB): SARS Coronavirus 2: NEGATIVE

## 2018-07-07 LAB — URINALYSIS, ROUTINE W REFLEX MICROSCOPIC
Bilirubin Urine: NEGATIVE
Glucose, UA: NEGATIVE mg/dL
Hgb urine dipstick: NEGATIVE
Ketones, ur: 20 mg/dL — AB
Nitrite: NEGATIVE
Protein, ur: NEGATIVE mg/dL
Specific Gravity, Urine: 1.044 — ABNORMAL HIGH (ref 1.005–1.030)
pH: 6 (ref 5.0–8.0)

## 2018-07-07 LAB — POCT I-STAT CREATININE: Creatinine, Ser: 0.5 mg/dL (ref 0.44–1.00)

## 2018-07-07 SURGERY — Surgical Case
Anesthesia: *Unknown

## 2018-07-07 SURGERY — APPENDECTOMY, LAPAROSCOPIC
Anesthesia: General | Site: Abdomen

## 2018-07-07 MED ORDER — SUGAMMADEX SODIUM 200 MG/2ML IV SOLN
INTRAVENOUS | Status: DC | PRN
  Administered 2018-07-07: 200 mg via INTRAVENOUS

## 2018-07-07 MED ORDER — MIDAZOLAM HCL 5 MG/5ML IJ SOLN
INTRAMUSCULAR | Status: DC | PRN
  Administered 2018-07-07: 2 mg via INTRAVENOUS

## 2018-07-07 MED ORDER — ACETAMINOPHEN 10 MG/ML IV SOLN
INTRAVENOUS | Status: DC | PRN
  Administered 2018-07-07: 1000 mg via INTRAVENOUS

## 2018-07-07 MED ORDER — PROPOFOL 10 MG/ML IV BOLUS
INTRAVENOUS | Status: AC
  Filled 2018-07-07: qty 20

## 2018-07-07 MED ORDER — IOHEXOL 300 MG/ML  SOLN
30.0000 mL | Freq: Once | INTRAMUSCULAR | Status: AC | PRN
  Administered 2018-07-07: 30 mL via ORAL

## 2018-07-07 MED ORDER — METRONIDAZOLE IN NACL 5-0.79 MG/ML-% IV SOLN
500.0000 mg | Freq: Once | INTRAVENOUS | Status: AC
  Administered 2018-07-07: 500 mg via INTRAVENOUS
  Filled 2018-07-07: qty 100

## 2018-07-07 MED ORDER — BUPIVACAINE HCL (PF) 0.5 % IJ SOLN
INTRAMUSCULAR | Status: AC
  Filled 2018-07-07: qty 30

## 2018-07-07 MED ORDER — SUCCINYLCHOLINE CHLORIDE 20 MG/ML IJ SOLN
INTRAMUSCULAR | Status: DC | PRN
  Administered 2018-07-07: 80 mg via INTRAVENOUS

## 2018-07-07 MED ORDER — ONDANSETRON HCL 4 MG/2ML IJ SOLN
INTRAMUSCULAR | Status: AC
  Filled 2018-07-07: qty 2

## 2018-07-07 MED ORDER — FENTANYL CITRATE (PF) 100 MCG/2ML IJ SOLN
25.0000 ug | INTRAMUSCULAR | Status: DC | PRN
  Administered 2018-07-07: 25 ug via INTRAVENOUS
  Administered 2018-07-07: 50 ug via INTRAVENOUS

## 2018-07-07 MED ORDER — MIDAZOLAM HCL 2 MG/2ML IJ SOLN
INTRAMUSCULAR | Status: AC
  Filled 2018-07-07: qty 2

## 2018-07-07 MED ORDER — SODIUM CHLORIDE 0.9 % IV SOLN
2.0000 g | Freq: Once | INTRAVENOUS | Status: AC
  Administered 2018-07-07: 2 g via INTRAVENOUS
  Filled 2018-07-07: qty 20

## 2018-07-07 MED ORDER — LACTATED RINGERS IR SOLN
Status: DC | PRN
  Administered 2018-07-07: 1000 mL

## 2018-07-07 MED ORDER — LIDOCAINE 2% (20 MG/ML) 5 ML SYRINGE
INTRAMUSCULAR | Status: DC | PRN
  Administered 2018-07-07: 60 mg via INTRAVENOUS

## 2018-07-07 MED ORDER — FENTANYL CITRATE (PF) 250 MCG/5ML IJ SOLN
INTRAMUSCULAR | Status: AC
  Filled 2018-07-07: qty 5

## 2018-07-07 MED ORDER — PHENYLEPHRINE 40 MCG/ML (10ML) SYRINGE FOR IV PUSH (FOR BLOOD PRESSURE SUPPORT)
PREFILLED_SYRINGE | INTRAVENOUS | Status: DC | PRN
  Administered 2018-07-07: 80 ug via INTRAVENOUS

## 2018-07-07 MED ORDER — SODIUM CHLORIDE (PF) 0.9 % IJ SOLN
INTRAMUSCULAR | Status: AC
  Filled 2018-07-07: qty 50

## 2018-07-07 MED ORDER — BUPIVACAINE HCL (PF) 0.5 % IJ SOLN
INTRAMUSCULAR | Status: DC | PRN
  Administered 2018-07-07: 20 mL

## 2018-07-07 MED ORDER — IOHEXOL 300 MG/ML  SOLN
100.0000 mL | Freq: Once | INTRAMUSCULAR | Status: AC | PRN
  Administered 2018-07-07: 100 mL via INTRAVENOUS

## 2018-07-07 MED ORDER — 0.9 % SODIUM CHLORIDE (POUR BTL) OPTIME
TOPICAL | Status: DC | PRN
  Administered 2018-07-07: 1000 mL

## 2018-07-07 MED ORDER — ACETAMINOPHEN 10 MG/ML IV SOLN
INTRAVENOUS | Status: AC
  Filled 2018-07-07: qty 100

## 2018-07-07 MED ORDER — FENTANYL CITRATE (PF) 100 MCG/2ML IJ SOLN
INTRAMUSCULAR | Status: AC
  Filled 2018-07-07: qty 2

## 2018-07-07 MED ORDER — ROCURONIUM BROMIDE 10 MG/ML (PF) SYRINGE
PREFILLED_SYRINGE | INTRAVENOUS | Status: DC | PRN
  Administered 2018-07-07: 50 mg via INTRAVENOUS

## 2018-07-07 MED ORDER — SODIUM CHLORIDE 0.9 % IV SOLN
Freq: Once | INTRAVENOUS | Status: AC
  Administered 2018-07-07: 17:00:00 via INTRAVENOUS

## 2018-07-07 MED ORDER — PROPOFOL 10 MG/ML IV BOLUS
INTRAVENOUS | Status: DC | PRN
  Administered 2018-07-07: 130 mg via INTRAVENOUS

## 2018-07-07 MED ORDER — FENTANYL CITRATE (PF) 100 MCG/2ML IJ SOLN
INTRAMUSCULAR | Status: DC | PRN
  Administered 2018-07-07: 100 ug via INTRAVENOUS

## 2018-07-07 MED ORDER — KETOROLAC TROMETHAMINE 30 MG/ML IJ SOLN
INTRAMUSCULAR | Status: DC | PRN
  Administered 2018-07-07: 15 mg via INTRAVENOUS

## 2018-07-07 MED ORDER — PROMETHAZINE HCL 25 MG/ML IJ SOLN: 6.2500 mg | INTRAMUSCULAR | Status: DC | PRN

## 2018-07-07 MED ORDER — LACTATED RINGERS IV SOLN
INTRAVENOUS | Status: DC | PRN
  Administered 2018-07-07: 23:00:00 via INTRAVENOUS

## 2018-07-07 SURGICAL SUPPLY — 37 items
APPLIER CLIP 5 13 M/L LIGAMAX5 (MISCELLANEOUS)
CHLORAPREP W/TINT 26 (MISCELLANEOUS) ×3 IMPLANT
CLIP APPLIE 5 13 M/L LIGAMAX5 (MISCELLANEOUS) IMPLANT
COVER SURGICAL LIGHT HANDLE (MISCELLANEOUS) ×3 IMPLANT
COVER WAND RF STERILE (DRAPES) IMPLANT
CUTTER FLEX LINEAR 45M (STAPLE) ×2 IMPLANT
DECANTER SPIKE VIAL GLASS SM (MISCELLANEOUS) ×3 IMPLANT
DERMABOND ADVANCED (GAUZE/BANDAGES/DRESSINGS) ×2
DERMABOND ADVANCED .7 DNX12 (GAUZE/BANDAGES/DRESSINGS) ×1 IMPLANT
DRAPE LAPAROSCOPIC ABDOMINAL (DRAPES) ×1 IMPLANT
ELECT COAG MONOPOLAR (ELECTROSURGICAL)
ELECT REM PT RETURN 15FT ADLT (MISCELLANEOUS) ×3 IMPLANT
ELECTRODE COAG MONOPOLAR (ELECTROSURGICAL) ×1 IMPLANT
GLOVE BIOGEL PI IND STRL 6.5 (GLOVE) IMPLANT
GLOVE BIOGEL PI IND STRL 8 (GLOVE) IMPLANT
GLOVE BIOGEL PI INDICATOR 6.5 (GLOVE) ×2
GLOVE BIOGEL PI INDICATOR 8 (GLOVE) ×2
GLOVE ECLIPSE 8.0 STRL XLNG CF (GLOVE) ×2 IMPLANT
GLOVE SURG SIGNA 7.5 PF LTX (GLOVE) ×10 IMPLANT
GOWN STRL REUS W/TWL XL LVL3 (GOWN DISPOSABLE) ×10 IMPLANT
KIT BASIN OR (CUSTOM PROCEDURE TRAY) ×3 IMPLANT
KIT TURNOVER KIT A (KITS) ×2 IMPLANT
POUCH SPECIMEN RETRIEVAL 10MM (ENDOMECHANICALS) ×3 IMPLANT
RELOAD 45 VASCULAR/THIN (ENDOMECHANICALS) IMPLANT
RELOAD STAPLE 45 2.5 WHT GRN (ENDOMECHANICALS) IMPLANT
RELOAD STAPLE 45 3.5 BLU ETS (ENDOMECHANICALS) IMPLANT
RELOAD STAPLE TA45 3.5 REG BLU (ENDOMECHANICALS) ×3 IMPLANT
SET IRRIG TUBING LAPAROSCOPIC (IRRIGATION / IRRIGATOR) ×3 IMPLANT
SET TUBE SMOKE EVAC HIGH FLOW (TUBING) ×3 IMPLANT
SHEARS HARMONIC ACE PLUS 36CM (ENDOMECHANICALS) ×2 IMPLANT
SLEEVE XCEL OPT CAN 5 100 (ENDOMECHANICALS) ×3 IMPLANT
SUT MNCRL AB 4-0 PS2 18 (SUTURE) ×3 IMPLANT
TOWEL OR 17X26 10 PK STRL BLUE (TOWEL DISPOSABLE) ×3 IMPLANT
TOWEL OR NON WOVEN STRL DISP B (DISPOSABLE) ×3 IMPLANT
TRAY LAPAROSCOPIC (CUSTOM PROCEDURE TRAY) ×3 IMPLANT
TROCAR BLADELESS OPT 5 100 (ENDOMECHANICALS) ×3 IMPLANT
TROCAR XCEL BLUNT TIP 100MML (ENDOMECHANICALS) ×3 IMPLANT

## 2018-07-07 NOTE — H&P (Signed)
Maria Wall is an 68 y.o. female.   Chief Complaint: RLQ abdominal pain HPI: This is a 68 year old female who was sent to the emergency department today by her primary care provider after a CT scan of the abdomen pelvis showed acute appendicitis.  She reports she started having vague mid abdominal pain on Wednesday.  She had a low-grade fever.  The pain was cramping in nature with some bloating.  The pain is since moved to the right lower quadrant.  Denies nausea or vomiting.  Bowel movements are normal.  A CT scan showed a dilated appendix with periappendiceal inflammation consistent with acute appendicitis.  Incidentally, she also has left hydronephrosis from obstructing ureteral stone on the left side.  She does have a history of kidney stones. she has no previous surgical history.  He is otherwise healthy without complaints.  The pain is now moderate in intensity, sharp, and in the right lower quadrant.  She denies dysuria or hematuria.  History reviewed. No pertinent past medical history.  History reviewed. No pertinent surgical history.  Family History  Problem Relation Age of Onset  . Hypertension Mother   . Osteoporosis Mother   . Stroke Mother   . Alzheimer's disease Father   . Heart disease Father   . Hypertension Father   . Asthma Brother   . Depression Maternal Grandfather   . Breast cancer Neg Hx    Social History:  reports that she has never smoked. She has never used smokeless tobacco. She reports that she does not drink alcohol or use drugs.  Allergies: No Known Allergies  (Not in a hospital admission)   Results for orders placed or performed during the hospital encounter of 07/07/18 (from the past 48 hour(s))  Comprehensive metabolic panel     Status: Abnormal   Collection Time: 07/07/18  4:46 PM  Result Value Ref Range   Sodium 135 135 - 145 mmol/L   Potassium 3.7 3.5 - 5.1 mmol/L   Chloride 102 98 - 111 mmol/L   CO2 24 22 - 32 mmol/L   Glucose, Bld 95 70 - 99  mg/dL   BUN 8 8 - 23 mg/dL   Creatinine, Ser 0.55 0.44 - 1.00 mg/dL   Calcium 11.4 (H) 8.9 - 10.3 mg/dL   Total Protein 7.8 6.5 - 8.1 g/dL   Albumin 4.6 3.5 - 5.0 g/dL   AST 29 15 - 41 U/L   ALT 21 0 - 44 U/L   Alkaline Phosphatase 73 38 - 126 U/L   Total Bilirubin 0.7 0.3 - 1.2 mg/dL   GFR calc non Af Amer >60 >60 mL/min   GFR calc Af Amer >60 >60 mL/min   Anion gap 9 5 - 15    Comment: Performed at [facility], [place] [address], [place], [place] [address]  CBC with Differential     Status: None   Collection Time: 07/07/18  4:46 PM  Result Value Ref Range   WBC 9.8 4.0 - 10.5 K/uL   RBC 4.80 3.87 - 5.11 MIL/uL   Hemoglobin 14.4 12.0 - 15.0 g/dL   HCT 44.4 36.0 - 46.0 %   MCV 92.5 80.0 - 100.0 fL   MCH 30.0 26.0 - 34.0 pg   MCHC 32.4 30.0 - 36.0 g/dL   RDW 13.1 11.5 - 15.5 %   Platelets 199 150 - 400 K/uL   nRBC 0.0 0.0 - 0.2 %   Neutrophils Relative % 78 %   Neutro Abs 7.6 1.7 -  7.7 K/uL   Lymphocytes Relative 14 %   Lymphs Abs 1.4 0.7 - 4.0 K/uL   Monocytes Relative 7 %   Monocytes Absolute 0.7 0.1 - 1.0 K/uL   Eosinophils Relative 1 %   Eosinophils Absolute 0.1 0.0 - 0.5 K/uL   Basophils Relative 0 %   Basophils Absolute 0.0 0.0 - 0.1 K/uL   Immature Granulocytes 0 %   Abs Immature Granulocytes 0.04 0.00 - 0.07 K/uL    Comment: Performed at [facility], [place] [address] [place], [place] [address]  Urinalysis, Routine w reflex microscopic     Status: Abnormal   Collection Time: 07/07/18  5:53 PM  Result Value Ref Range   Color, Urine YELLOW YELLOW   APPearance CLEAR CLEAR   Specific Gravity, Urine 1.044 (H) 1.005 - 1.030   pH 6.0 5.0 - 8.0   Glucose, UA NEGATIVE NEGATIVE mg/dL   Hgb urine dipstick NEGATIVE NEGATIVE   Bilirubin Urine NEGATIVE NEGATIVE   Ketones, ur 20 (A) NEGATIVE mg/dL   Protein, ur NEGATIVE NEGATIVE mg/dL   Nitrite NEGATIVE NEGATIVE   Leukocytes,Ua SMALL (A) NEGATIVE   RBC / HPF 0-5 0 - 5 RBC/hpf   WBC, UA  11-20 0 - 5 WBC/hpf   Bacteria, UA RARE (A) NONE SEEN    Comment: Performed at [facility], [place] [address] [place], [place] [address]   Ct Abdomen Pelvis W Contrast  Result Date: 07/07/2018 CLINICAL DATA:  Right lower quadrant abdominal pain. EXAM: CT ABDOMEN AND PELVIS WITH CONTRAST TECHNIQUE: Multidetector CT imaging of the abdomen and pelvis was performed using the standard protocol following bolus administration of intravenous contrast. CONTRAST:  144mL OMNIPAQUE IOHEXOL 300 MG/ML SOLN, 66mL OMNIPAQUE IOHEXOL 300 MG/ML SOLN COMPARISON:  None. FINDINGS: Lower chest: Slight elevation of the right hemidiaphragm. Otherwise normal. Hepatobiliary: Scattered small hepatic cysts. No worrisome liver lesion. Biliary tree appears normal. Pancreas: Unremarkable. No pancreatic ductal dilatation or surrounding inflammatory changes. Spleen: Normal in size without focal abnormality. Adrenals/Urinary Tract: Marked left hydronephrosis. The ureter is markedly dilated to the level 15 mm stone at the left ureterovesical junction. There is a 3 mm stone in the dilated left ureter proximal to this large stone. Right kidney is normal. Adrenal glands are normal. Bladder is normal. Stomach/Bowel: There is acute appendicitis. The appendix is dilated 14 mm with periappendiceal soft tissue stranding. There is also prominent mucosal thickening of the tip of the cecum. There is slight mucosal thickening of the adjacent terminal ileum. Vascular/Lymphatic: Aortic atherosclerosis. No enlarged abdominal or pelvic lymph nodes. Reproductive: Ovaries appear normal.  Fibroids in the uterus. Other: Small amount of free fluid in the pelvic cul-de-sac. No free air. No abdominal wall hernia. Musculoskeletal: Degenerative changes in the lumbar spine. No acute bone abnormality. IMPRESSION: 1. Acute appendicitis with inflammation of the adjacent tip of the cecum and terminal ileum. 2. 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 3 mm stone in the left ureter proximal to the large stone. Electronically Signed   By: Lorriane Shire M.D.   On: 07/07/2018 15:15    Review of Systems  Constitutional: Positive for fever. Negative for chills.  Respiratory: Negative for cough and shortness of breath.   Cardiovascular: Negative for chest pain.  Gastrointestinal: Positive for abdominal pain. Negative for constipation, diarrhea, nausea and vomiting.  Genitourinary: Negative for dysuria and hematuria.  Musculoskeletal: Negative for myalgias.  All other systems reviewed and are negative.  Blood pressure 138/77, pulse 66, temperature 98.3 F (36.8 C), temperature source Oral, resp. rate 16, height 5\' 4"  (1.626 m), weight 76.7 kg, SpO2 98 %. Physical Exam  Constitutional: She is oriented to person, place, and time. She appears well-developed and well-nourished. No distress.  HENT:  Head: Normocephalic and atraumatic.  Right Ear: External ear normal.  Left Ear: External ear normal.  Nose: Nose normal.  Mouth/Throat: Oropharynx is clear and moist. No oropharyngeal exudate.  Eyes: Pupils are equal, round, and reactive to light. Conjunctivae are normal. Right eye exhibits no discharge. Left eye exhibits no discharge. No scleral icterus.  Neck: Normal range of motion. No tracheal deviation present. No thyromegaly present.  Cardiovascular: Normal rate, regular rhythm, normal heart sounds and intact distal pulses.  No murmur heard. Respiratory: Effort normal and breath sounds normal. No respiratory distress. She has no wheezes. She exhibits no tenderness.  GI: Soft. She exhibits no distension. There is abdominal tenderness. There is guarding.  There is mild tenderness with guarding in the right lower quadrant.  Musculoskeletal: Normal range of motion.        General: No deformity or edema.  Neurological: She is alert and oriented to person, place, and time.  Skin: Skin is warm and dry.  She is not diaphoretic. No erythema. No pallor.  Psychiatric: Her behavior is normal. Judgment normal.     Assessment/Plan Acute appendicitis  I discussed the diagnosis with the patient.  Surgical removal of the appendix is recommended given his acute distention and inflammation.  I discussed proceeding with a laparoscopic appendectomy.  I discussed the risks of surgery which includes but is not limited to bleeding, infection, injury to surrounding structures, appendiceal stump leak, the need for further procedures, the potential finding malignancy, cardiopulmonary issues, DVT, etc.  She understands and agrees to proceed with surgery. The CT scan does demonstrate a chronically obstructing left ureteral stone with left hydronephrosis.  I will consult urology for their opinion.  She has a history of kidney stones but has not had one for many years and has not had any surgical procedures regarding stones.  Coralie Keens, MD 07/07/2018, 8:07 PM

## 2018-07-07 NOTE — Assessment & Plan Note (Signed)
BP within goal at 138/78 today.  Patient reports that she needs refill of her amlodipine.  Continue current medications and monitor BP.

## 2018-07-07 NOTE — ED Triage Notes (Signed)
Pt states abd pain since Wednesday. Pt had a CT today at 1530 that showed appendicitis. Pt also had a 15 mm stone.

## 2018-07-07 NOTE — Telephone Encounter (Signed)
Sherri from [facility] called concerning pt's scan that was ordered today 07/07/18. Her labs are not back so she will not be able to do the scan without another lab ordered stat or having the order replaced to be done without contrast.

## 2018-07-07 NOTE — ED Notes (Signed)
Dr. Rees at bedside  

## 2018-07-07 NOTE — Progress Notes (Signed)
   Subjective:    Patient ID: Maria Wall, female    DOB: 1950-08-04, 68 y.o.   MRN: [id number]   CC: RLQ abdominal pain   HPI: [name] is a 68 year old female presenting to discuss the following:  Abdominal pain:   Smoking status reviewed  Review of Systems Per HPI, also denies recent illness, fever, headache, changes in vision, chest pain, shortness of breath, abdominal pain, N/V/D, weakness   Patient Active Problem List   Diagnosis Date Noted  . Essential hypertension 07/22/2016  . Weight loss counseling, encounter for 07/22/2016  . Healthcare maintenance 07/22/2016  . Fatigue 06/11/2016     Objective:  There were no vitals taken for this visit. Vitals and nursing note reviewed  General: NAD, pleasant Cardiac: RRR, normal heart sounds, no murmurs Respiratory: CTAB, normal effort Abdomen: soft, nontender, nondistended Extremities: no edema or cyanosis. WWP. Skin: warm and dry, no rashes noted Neuro: alert and oriented, no focal deficits Psych: normal affect  Assessment & Plan:    No problem-specific Assessment & Plan notes found for this encounter.    Darrelyn Hillock, DO Family Medicine Resident PGY-1

## 2018-07-07 NOTE — Telephone Encounter (Signed)
Labs ordered.  Rebeckah Masih,CMA

## 2018-07-07 NOTE — Transfer of Care (Signed)
Immediate Anesthesia Transfer of Care Note  Patient: [facility]  Procedure(s) Performed: APPENDECTOMY LAPAROSCOPIC (N/A Abdomen)  Patient Location: PACU  Anesthesia Type:General  Level of Consciousness: sedated, patient cooperative and responds to stimulation  Airway & Oxygen Therapy: Patient Spontanous Breathing and Patient connected to face mask oxygen  Post-op Assessment: Report given to RN and Post -op Vital signs reviewed and stable  Post vital signs: Reviewed and stable  Last Vitals:  Vitals Value Taken Time  BP    Temp    Pulse    Resp    SpO2      Last Pain:  Vitals:   07/07/18 1927  TempSrc:   PainSc: 3          Complications: No apparent anesthesia complications

## 2018-07-07 NOTE — Op Note (Signed)
Appendectomy, Lap, Procedure Note  Indications: The patient presented with a history of right-sided abdominal pain. A CT revealed findings consistent with acute appendicitis.  Pre-operative Diagnosis: acute appendicitis  Post-operative Diagnosis: Same  Surgeon: Coralie Keens   Assistants: 0  Anesthesia: General endotracheal anesthesia  ASA Class: 2  Procedure Details  The patient was seen again in the Holding Room. The risks, benefits, complications, treatment options, and expected outcomes were discussed with the patient and/or family. The possibilities of reaction to medication, perforation of viscus, bleeding, recurrent infection, finding a normal appendix, the need for additional procedures, failure to diagnose a condition, and creating a complication requiring transfusion or operation were discussed. There was concurrence with the proposed plan and informed consent was obtained. The site of surgery was properly noted. The patient was taken to Operating Room, identified as [facility] and the procedure verified as Appendectomy. A Time Out was held and the above information confirmed.  The patient was placed in the supine position and general anesthesia was induced, along with placement of orogastric tube, Venodyne boots, and a Foley catheter. The abdomen was prepped and draped in a sterile fashion. A one centimeter infraumbilical incision was made.  The  fascia was incised with a #15 blade.  A Kelly clamp was used to confirm entrance into the peritoneal cavity.  A pursestring suture was passed around the incision with a 0 Vicryl.  The Hasson was introduced into the abdomen and the tails of the suture were used to hold the Hasson in place.   The pneumoperitoneum was then established to steady pressure of 15 mmHg.  Additional 5 mm cannulas then placed in the left lower quadrant of the abdomen and the right upper quadrant under direct visualization. A careful evaluation of the entire abdomen  was carried out. The patient was placed in Trendelenburg and left lateral decubitus position. The small intestines were retracted in the cephalad and left lateral direction away from the pelvis and right lower quadrant. The patient was found to have an enlarged and inflamed appendix.. There was no evidence of perforation.  The appendix was carefully dissected. The appendix was was skeletonized with the harmonic scalpel.   The appendix was divided at its base using an endo-GIA stapler. Minimal appendiceal stump was left in place. There was no evidence of bleeding, leakage, or complication after division of the appendix. Irrigation was also performed and irrigate suctioned from the abdomen as well.  The umbilical port site was closed with the purse string suture. There was no residual palpable fascial defect.  The trocar site skin wounds were closed with 4-0 Monocryl.  Instrument, sponge, and needle counts were correct at the conclusion of the case.   Findings: The appendix was found to be inflamed and distended with some fibrinous exudate. There were not signs of necrosis.  There was not perforation. There was not abscess formation.  Estimated Blood Loss:  Minimal         Drains:none         Complications:  None; patient tolerated the procedure well.         Disposition: PACU - hemodynamically stable.         Condition: stable

## 2018-07-07 NOTE — Assessment & Plan Note (Signed)
Concerning for appendicitis given history of pain moving from across belly and isolating to McBurney's point.  However patient without fever, nausea, vomiting or aversion to food.  She is otherwise well-appearing and vitals are stable. -Obtain CBC with differential, CMP -Will obtain CT abd/pelvis with contrast- wnl kidney function 06/11/2016

## 2018-07-07 NOTE — ED Provider Notes (Signed)
[facility] DEPT Provider Note   CSN: [id number] Arrival date & time: 07/07/18  1603    History   Chief Complaint Chief Complaint  Patient presents with  . Abdominal Pain    HPI Maria Wall is a 68 y.o. female.     The history is provided by the patient and medical records. No language interpreter was used.  Abdominal Pain   Maria Wall is a 68 y.o. female who presents to the Emergency Department complaining of abdominal pain. Presents to the emergency department for evaluation of abdominal pain and abnormal CT scan. Two days ago she began feeling poorly with upper abdominal bloating and discomfort. Yesterday her pain began to radiate down to her right lower quadrant. She has decreased appetite but denies any nausea, vomiting, diarrhea, dysuria. She had a temperature at home to 99. She was evaluated earlier today and had an outpatient CT scan that demonstrated acute appendicitis and she was referred to the emergency department for further evaluation. She has a history of hypertension, no additional medical problems. No prior abdominal surgeries. She last ate last night. No known coronavirus exposures. Symptoms are moderate, constant, worsening. History reviewed. No pertinent past medical history.  Patient Active Problem List   Diagnosis Date Noted  . Right lower quadrant abdominal pain 07/07/2018  . Acute appendicitis 07/07/2018  . Essential hypertension 07/22/2016  . Fatigue 06/11/2016    History reviewed. No pertinent surgical history.   OB History    Gravida  3   Para  3   Term  3   Preterm  0   AB  0   Living  2     SAB      TAB      Ectopic      Multiple      Live Births           Obstetric Comments  Son died at 82 months.          Home Medications    Prior to Admission medications   Medication Sig Start Date End Date Taking? Authorizing Provider  amLODipine (NORVASC) 5 MG tablet Take 1 tablet by mouth once daily  05/04/18   Caroline More, DO  hydrocortisone 2.5 % ointment Apply topically 2 (two) times daily. As needed for mild eczema. 07/22/16   Verner Mould, MD    Family History Family History  Problem Relation Age of Onset  . Hypertension Mother   . Osteoporosis Mother   . Stroke Mother   . Alzheimer's disease Father   . Heart disease Father   . Hypertension Father   . Asthma Brother   . Depression Maternal Grandfather   . Breast cancer Neg Hx     Social History Social History   Tobacco Use  . Smoking status: Never Smoker  . Smokeless tobacco: Never Used  Substance Use Topics  . Alcohol use: No  . Drug use: No     Allergies   Patient has no known allergies.   Review of Systems Review of Systems  Gastrointestinal: Positive for abdominal pain.  All other systems reviewed and are negative.    Physical Exam Updated Vital Signs BP (!) 164/83   Pulse 64   Temp 98.2 F (36.8 C)   Resp 16   Ht 5\' 4"  (1.626 m)   Wt 76.7 kg   SpO2 100%   BMI 29.01 kg/m   Physical Exam Vitals signs and nursing note reviewed.  Constitutional:  Appearance: She is well-developed.  HENT:     Head: Normocephalic and atraumatic.  Cardiovascular:     Rate and Rhythm: Normal rate and regular rhythm.  Pulmonary:     Effort: Pulmonary effort is normal. No respiratory distress.  Abdominal:     Palpations: Abdomen is soft.     Tenderness: There is no guarding or rebound.     Comments: Moderate upper abdominal as well as right lower quadrant tenderness without guarding or rebound  Musculoskeletal:        General: No swelling or tenderness.  Skin:    General: Skin is warm and dry.  Neurological:     Mental Status: She is alert and oriented to person, place, and time.  Psychiatric:        Behavior: Behavior normal.      ED Treatments / Results  Labs (all labs ordered are listed, but only abnormal results are displayed) Labs Reviewed  COMPREHENSIVE METABOLIC PANEL -  Abnormal; Notable for the following components:      Result Value   Calcium 11.4 (*)    All other components within normal limits  URINALYSIS, ROUTINE W REFLEX MICROSCOPIC - Abnormal; Notable for the following components:   Specific Gravity, Urine 1.044 (*)    Ketones, ur 20 (*)    Leukocytes,Ua SMALL (*)    Bacteria, UA RARE (*)    All other components within normal limits  SARS CORONAVIRUS 2 (HOSPITAL ORDER, [facility])  CBC WITH DIFFERENTIAL/PLATELET  SURGICAL PATHOLOGY    EKG None  Radiology Ct Abdomen Pelvis W Contrast  Result Date: 07/07/2018 CLINICAL DATA:  Right lower quadrant abdominal pain. EXAM: CT ABDOMEN AND PELVIS WITH CONTRAST TECHNIQUE: Multidetector CT imaging of the abdomen and pelvis was performed using the standard protocol following bolus administration of intravenous contrast. CONTRAST:  117mL OMNIPAQUE IOHEXOL 300 MG/ML SOLN, 27mL OMNIPAQUE IOHEXOL 300 MG/ML SOLN COMPARISON:  None. FINDINGS: Lower chest: Slight elevation of the right hemidiaphragm. Otherwise normal. Hepatobiliary: Scattered small hepatic cysts. No worrisome liver lesion. Biliary tree appears normal. Pancreas: Unremarkable. No pancreatic ductal dilatation or surrounding inflammatory changes. Spleen: Normal in size without focal abnormality. Adrenals/Urinary Tract: Marked left hydronephrosis. The ureter is markedly dilated to the level 15 mm stone at the left ureterovesical junction. There is a 3 mm stone in the dilated left ureter proximal to this large stone. Right kidney is normal. Adrenal glands are normal. Bladder is normal. Stomach/Bowel: There is acute appendicitis. The appendix is dilated 14 mm with periappendiceal soft tissue stranding. There is also prominent mucosal thickening of the tip of the cecum. There is slight mucosal thickening of the adjacent terminal ileum. Vascular/Lymphatic: Aortic atherosclerosis. No enlarged abdominal or pelvic lymph nodes. Reproductive:  Ovaries appear normal.  Fibroids in the uterus. Other: Small amount of free fluid in the pelvic cul-de-sac. No free air. No abdominal wall hernia. Musculoskeletal: Degenerative changes in the lumbar spine. No acute bone abnormality. IMPRESSION: 1. Acute appendicitis with inflammation of the adjacent tip of the cecum and terminal ileum. 2. Chronic left hydronephrosis due to obstruction of the distal left ureter by a 15 mm stone. 3 mm stone in the left ureter proximal to the large stone. Electronically Signed   By: Lorriane Shire M.D.   On: 07/07/2018 15:15    Procedures Procedures (including critical care time)  Medications Ordered in ED Medications  fentaNYL (SUBLIMAZE) injection 25-50 mcg (25 mcg Intravenous Given 07/07/18 2352)  promethazine (PHENERGAN) injection 6.25-12.5 mg (has no  administration in time range)  fentaNYL (SUBLIMAZE) 100 MCG/2ML injection (has no administration in time range)  cefTRIAXone (ROCEPHIN) 2 g in sodium chloride 0.9 % 100 mL IVPB (0 g Intravenous Stopped 07/07/18 1743)    And  metroNIDAZOLE (FLAGYL) IVPB 500 mg (0 mg Intravenous Stopped 07/07/18 1849)  0.9 %  sodium chloride infusion ( Intravenous Stopped 07/07/18 2130)     Initial Impression / Assessment and Plan / ED Course  I have reviewed the triage vital signs and the nursing notes.  Pertinent labs & imaging results that were available during my care of the patient were reviewed by me and considered in my medical decision making (see chart for details).       Patient here for evaluation of abdominal pain, CT scan performed as an outpatient at 315 today demonstrates acute appendicitis as well as chronic left hydronephrosis and distal ureteral obstruction. Discussed with patient findings of CT scan with appendicitis as well as ureteral stone. General surgery consulted for admission. Patient was treated with antibiotics, IV fluids. Unable to view labs in the system, will check CBC, CMP and UA. Patient declines any  pain medications in the emergency department.  Final Clinical Impressions(s) / ED Diagnoses   Final diagnoses:  Acute appendicitis, unspecified acute appendicitis type  Ureteral stone    ED Discharge Orders    None       Quintella Reichert, MD 07/08/18 0006

## 2018-07-07 NOTE — Patient Instructions (Signed)
Thank you for coming to see me today. It was a pleasure! Today we talked about:   Your abdominal pain. We have collected labwork today, and I will put these on mychart and then call you if they are abnormal.   Your scan is at 12:30 at: [facility] [place], [place], [place] [address]  Please follow-up as needed.  If you have any questions or concerns, please do not hesitate to call the office at [phone].  Take Care,   [place] Tylin Stradley, DO

## 2018-07-07 NOTE — Anesthesia Procedure Notes (Signed)
Procedure Name: Intubation Performed by: Particia Strahm J, CRNA Pre-anesthesia Checklist: Patient identified, Emergency Drugs available, Suction available, Patient being monitored and Timeout performed Patient Re-evaluated:Patient Re-evaluated prior to induction Oxygen Delivery Method: Circle system utilized Preoxygenation: Pre-oxygenation with 100% oxygen Induction Type: IV induction Ventilation: Mask ventilation without difficulty Laryngoscope Size: Mac and 4 Grade View: Grade I Tube type: Oral Tube size: 7.0 mm Number of attempts: 1 Airway Equipment and Method: Stylet Placement Confirmation: ETT inserted through vocal cords under direct vision,  positive ETCO2,  CO2 detector and breath sounds checked- equal and bilateral Secured at: 21 cm Tube secured with: Tape Dental Injury: Teeth and Oropharynx as per pre-operative assessment        

## 2018-07-07 NOTE — Anesthesia Preprocedure Evaluation (Signed)
Anesthesia Evaluation  Patient identified by MRN, date of birth, ID band Patient awake    Reviewed: Allergy & Precautions, NPO status , Patient's Chart, lab work & pertinent test results  Airway Mallampati: II  TM Distance: >3 FB Neck ROM: Full    Dental  (+) Teeth Intact, Dental Advisory Given, Caps   Pulmonary neg pulmonary ROS,    Pulmonary exam normal breath sounds clear to auscultation       Cardiovascular hypertension, Pt. on medications Normal cardiovascular exam Rhythm:Regular Rate:Normal     Neuro/Psych negative neurological ROS     GI/Hepatic Neg liver ROS, Acute appendicitis   Endo/Other  negative endocrine ROS  Renal/GU negative Renal ROS     Musculoskeletal negative musculoskeletal ROS (+)   Abdominal   Peds  Hematology negative hematology ROS (+)   Anesthesia Other Findings Day of surgery medications reviewed with the patient.  Reproductive/Obstetrics                             Anesthesia Physical Anesthesia Plan  ASA: II and emergent  Anesthesia Plan: General   Post-op Pain Management:    Induction: Intravenous  PONV Risk Score and Plan: 4 or greater and Midazolam, Diphenhydramine, Dexamethasone and Ondansetron  Airway Management Planned: Oral ETT  Additional Equipment:   Intra-op Plan:   Post-operative Plan: Extubation in OR  Informed Consent: I have reviewed the patients History and Physical, chart, labs and discussed the procedure including the risks, benefits and alternatives for the proposed anesthesia with the patient or authorized representative who has indicated his/her understanding and acceptance.     Dental advisory given  Plan Discussed with: CRNA  Anesthesia Plan Comments:         Anesthesia Quick Evaluation

## 2018-07-07 NOTE — Addendum Note (Signed)
Addended by: Aiken Withem, [place] J on: 07/07/2018 02:12 PM   Modules accepted: Orders

## 2018-07-07 NOTE — Progress Notes (Signed)
  Subjective:  Patient ID: Maria Wall  DOB: Apr 03, 1950 MRN: [id number]  Maria Wall is a 68 y.o. female with a PMH of hypertension, here today for RLQ abdominal pain.   HPI:  RLQ pain: -Pain started on Wednesday, midday- lower abdominal discomfort; it became increasingly worse and moved to the RLQ. Almost went to the ED last night as it was painful.  -No fevers, no N/V, no diarrhea- some constipation- once she drinks coffee, she can have a bm-and has had soft bm this am. No blood in stool or dark stools.  -Never had this pain before- has had issues with constipation -no h/o abdominal surgeries -not taking any new medications, has taken miralax; worse with bending over Lives in [place]  - no inc urinary frequency, urinary urgency or dysuria; no abdominal vaginal discharge or bleeding  Hypertension: - Medications: Amlodipine 5 mg  - Compliance: yes - Checking BP at home: no - Denies any SOB, CP, medication SEs, or symptoms of hypotension  ROS: All other systems otherwise negative, except as mentioned in HPI  Social hx: Denies use of illicit drugs, alcohol use Smoking status reviewed  Patient Active Problem List   Diagnosis Date Noted  . Right lower quadrant abdominal pain 07/07/2018  . Essential hypertension 07/22/2016  . Fatigue 06/11/2016     Objective:  BP 138/70   Pulse 68   Temp 98.8 F (37.1 C) (Oral)   SpO2 99%   Vitals and nursing note reviewed  General: NAD, pleasant, well-appearing Pulm: normal effort GI: soft, tender at McBurney's point, negative Rovsing sign, positive obturator sign, nondistended, normoactive bowel sounds x4 Extremities: no edema or cyanosis. WWP. Skin: warm and dry, no rashes noted Neuro: alert and oriented, no focal deficits Psych: normal affect, normal thought content  Assessment & Plan:   Right lower quadrant abdominal pain Concerning for appendicitis given history of pain moving from across belly and isolating to McBurney's point.   However patient without fever, nausea, vomiting or aversion to food.  She is otherwise well-appearing and vitals are stable. -Obtain CBC with differential, CMP -Will obtain CT abd/pelvis with contrast- wnl kidney function 06/11/2016  Essential hypertension BP within goal at 138/78 today.  Patient reports that she needs refill of her amlodipine.  Continue current medications and monitor BP.   Maria Danira Nylander, DO Family Medicine Resident PGY-2

## 2018-07-08 DIAGNOSIS — K3580 Unspecified acute appendicitis: Secondary | ICD-10-CM | POA: Diagnosis not present

## 2018-07-08 LAB — COMPREHENSIVE METABOLIC PANEL
ALT: 16 IU/L (ref 0–32)
AST: 20 IU/L (ref 0–40)
Albumin/Globulin Ratio: 1.8 (ref 1.2–2.2)
Albumin: 4.7 g/dL (ref 3.8–4.8)
Alkaline Phosphatase: 82 IU/L (ref 39–117)
BUN/Creatinine Ratio: 14 (ref 12–28)
BUN: 9 mg/dL (ref 8–27)
Bilirubin Total: 0.7 mg/dL (ref 0.0–1.2)
CO2: 24 mmol/L (ref 20–29)
Calcium: 12.2 mg/dL — ABNORMAL HIGH (ref 8.7–10.3)
Chloride: 101 mmol/L (ref 96–106)
Creatinine, Ser: 0.66 mg/dL (ref 0.57–1.00)
GFR calc Af Amer: 105 mL/min/{1.73_m2} (ref >59)
GFR calc non Af Amer: 91 mL/min/{1.73_m2} (ref >59)
Globulin, Total: 2.6 g/dL (ref 1.5–4.5)
Glucose: 93 mg/dL (ref 65–99)
Potassium: 4.4 mmol/L (ref 3.5–5.2)
Sodium: 139 mmol/L (ref 134–144)
Total Protein: 7.3 g/dL (ref 6.0–8.5)

## 2018-07-08 LAB — CBC WITH DIFFERENTIAL/PLATELET
Basophils Absolute: 0.1 10*3/uL (ref 0.0–0.2)
Basos: 1 %
EOS (ABSOLUTE): 0.1 10*3/uL (ref 0.0–0.4)
Eos: 1 %
Hematocrit: 41.3 % (ref 34.0–46.6)
Hemoglobin: 13.8 g/dL (ref 11.1–15.9)
Immature Grans (Abs): 0 10*3/uL (ref 0.0–0.1)
Immature Granulocytes: 0 %
Lymphocytes Absolute: 1.7 10*3/uL (ref 0.7–3.1)
Lymphs: 18 %
MCH: 30.9 pg (ref 26.6–33.0)
MCHC: 33.4 g/dL (ref 31.5–35.7)
MCV: 93 fL (ref 79–97)
Monocytes Absolute: 0.7 10*3/uL (ref 0.1–0.9)
Monocytes: 7 %
Neutrophils Absolute: 6.9 10*3/uL (ref 1.4–7.0)
Neutrophils: 73 %
Platelets: 199 10*3/uL (ref 150–450)
RBC: 4.46 x10E6/uL (ref 3.77–5.28)
RDW: 12.5 % (ref 11.7–15.4)
WBC: 9.4 10*3/uL (ref 3.4–10.8)

## 2018-07-08 MED ORDER — SUCCINYLCHOLINE CHLORIDE 200 MG/10ML IV SOSY
PREFILLED_SYRINGE | INTRAVENOUS | Status: AC
  Filled 2018-07-08: qty 10

## 2018-07-08 MED ORDER — OXYCODONE HCL 5 MG PO TABS: 5.0000 mg | ORAL_TABLET | Freq: Four times a day (QID) | ORAL | 0 refills | Status: DC | PRN

## 2018-07-08 MED ORDER — DIPHENHYDRAMINE HCL 50 MG/ML IJ SOLN: 12.5000 mg | Freq: Four times a day (QID) | INTRAMUSCULAR | Status: DC | PRN

## 2018-07-08 MED ORDER — AMLODIPINE BESYLATE 5 MG PO TABS
5.0000 mg | ORAL_TABLET | Freq: Every day | ORAL | Status: DC
  Administered 2018-07-08: 5 mg via ORAL
  Filled 2018-07-08: qty 1

## 2018-07-08 MED ORDER — OXYCODONE HCL 5 MG PO TABS
5.0000 mg | ORAL_TABLET | ORAL | Status: DC | PRN
  Administered 2018-07-08: 5 mg via ORAL
  Filled 2018-07-08: qty 1

## 2018-07-08 MED ORDER — DIPHENHYDRAMINE HCL 12.5 MG/5ML PO ELIX: 12.5000 mg | ORAL_SOLUTION | Freq: Four times a day (QID) | ORAL | Status: DC | PRN

## 2018-07-08 MED ORDER — KETOROLAC TROMETHAMINE 15 MG/ML IJ SOLN: 15.0000 mg | Freq: Four times a day (QID) | INTRAMUSCULAR | Status: DC | PRN

## 2018-07-08 MED ORDER — ENOXAPARIN SODIUM 40 MG/0.4ML ~~LOC~~ SOLN: 40.0000 mg | SUBCUTANEOUS | Status: DC

## 2018-07-08 MED ORDER — ONDANSETRON 4 MG PO TBDP: 4.0000 mg | ORAL_TABLET | Freq: Four times a day (QID) | ORAL | Status: DC | PRN

## 2018-07-08 MED ORDER — ONDANSETRON HCL 4 MG/2ML IJ SOLN: 4.0000 mg | Freq: Four times a day (QID) | INTRAMUSCULAR | Status: DC | PRN

## 2018-07-08 MED ORDER — POTASSIUM CHLORIDE IN NACL 20-0.9 MEQ/L-% IV SOLN
INTRAVENOUS | Status: DC
  Administered 2018-07-08: 04:00:00 via INTRAVENOUS
  Filled 2018-07-08 (×2): qty 1000

## 2018-07-08 MED ORDER — MORPHINE SULFATE (PF) 2 MG/ML IV SOLN
1.0000 mg | INTRAVENOUS | Status: DC | PRN
  Administered 2018-07-08: 4 mg via INTRAVENOUS
  Administered 2018-07-08: 2 mg via INTRAVENOUS
  Filled 2018-07-08: qty 2
  Filled 2018-07-08: qty 1

## 2018-07-08 MED ORDER — TRAMADOL HCL 50 MG PO TABS: 50.0000 mg | ORAL_TABLET | Freq: Four times a day (QID) | ORAL | Status: DC | PRN

## 2018-07-08 MED ORDER — AMOXICILLIN-POT CLAVULANATE 875-125 MG PO TABS: 1.0000 | ORAL_TABLET | Freq: Two times a day (BID) | ORAL | 0 refills | Status: AC

## 2018-07-08 MED ORDER — OXYCODONE HCL 5 MG PO TABS: 5.0000 mg | ORAL_TABLET | ORAL | 0 refills | Status: DC | PRN

## 2018-07-08 MED ORDER — ETOMIDATE 2 MG/ML IV SOLN
INTRAVENOUS | Status: AC
  Filled 2018-07-08: qty 10

## 2018-07-08 NOTE — Discharge Instructions (Signed)
[facility] ______[facility] LAPAROSCOPIC SURGERY: POST OP INSTRUCTIONS Always review your discharge instruction sheet given to you by the facility where your surgery was performed. IF YOU HAVE DISABILITY OR FAMILY LEAVE FORMS, YOU MUST BRING THEM TO THE OFFICE FOR PROCESSING.   DO NOT GIVE THEM TO YOUR DOCTOR.  1. A prescription for pain medication may be given to you upon discharge.  Take your pain medication as prescribed, if needed.  If narcotic pain medicine is not needed, then you may take acetaminophen (Tylenol) or ibuprofen (Advil) as needed. 2. Take your usually prescribed medications unless otherwise directed. 3. If you need a refill on your pain medication, please contact your pharmacy.  They will contact our office to request authorization. Prescriptions will not be filled after 5pm or on week-ends. 4. You should follow a light diet the first few days after arrival home, such as soup and crackers, etc.  Be sure to include lots of fluids daily. 5. Most patients will experience some swelling and bruising in the area of the incisions.  Ice packs will help.  Swelling and bruising can take several days to resolve.  6. It is common to experience some constipation if taking pain medication after surgery.  Increasing fluid intake and taking a stool softener (such as Colace) will usually help or prevent this problem from occurring.  A mild laxative (Milk of Magnesia or Miralax) should be taken according to package instructions if there are no bowel movements after 48 hours. 7. Unless discharge instructions indicate otherwise, you may remove your bandages 24-48 hours after surgery, and you may shower at that time.  You may have steri-strips (small skin tapes) in place directly over the incision.  These strips should be left on the skin for 7-10 days.  If your surgeon used skin glue on the incision, you may shower in 24 hours.  The glue will flake off over the next 2-3 weeks.  Any sutures or  staples will be removed at the office during your follow-up visit. 8. ACTIVITIES:  You may resume regular (light) daily activities beginning the next day--such as daily self-care, walking, climbing stairs--gradually increasing activities as tolerated.  You may have sexual intercourse when it is comfortable.  Refrain from any heavy lifting or straining until approved by your doctor. a. You may drive when you are no longer taking prescription pain medication, you can comfortably wear a seatbelt, and you can safely maneuver your car and apply brakes. b. RETURN TO WORK:  __________________________________________________________ 9. You should see your doctor in the office for a follow-up appointment approximately 2-3 weeks after your surgery.  Make sure that you call for this appointment within a day or two after you arrive home to insure a convenient appointment time. 10. OTHER INSTRUCTIONS:OK TO SHOWER STARTING TODAY 11. ICE PACK, TYLENOL, IBUPROFEN ALSO FOR PAIN 12. NO LIFTING MORE THAN 15 TO 20 POUNDS FOR 2 WEEKS __________________________________________________________________________________________________________________________ __________________________________________________________________________________________________________________________ WHEN TO CALL YOUR DOCTOR: 1. Fever over 101.0 2. Inability to urinate 3. Continued bleeding from incision. 4. Increased pain, redness, or drainage from the incision. 5. Increasing abdominal pain  The clinic staff is available to answer your questions during regular business hours.  Please dont hesitate to call and ask to speak to one of the nurses for clinical concerns.  If you have a medical emergency, go to the nearest emergency room or call 911.  A surgeon from [facility] is always on call at the hospital. [address], [employer]  [address], [place], [place]  [address] ? [address] [address], [place], [place]   [address] °[phone] ?  [phone] ? FAX [phone] °Web site: [email or web address] °

## 2018-07-08 NOTE — Progress Notes (Signed)
Patient ID: Maria Wall, female   DOB: Sep 15, 1950, 68 y.o.   MRN: [id number]   Doing well Pain controlled Abdomen soft, incisions clean  Plan: Discharge home today once seen by urology.

## 2018-07-08 NOTE — Anesthesia Postprocedure Evaluation (Signed)
Anesthesia Post Note  Patient: [facility]  Procedure(s) Performed: APPENDECTOMY LAPAROSCOPIC (N/A Abdomen)     Patient location during evaluation: PACU Anesthesia Type: General Level of consciousness: awake and alert Pain management: pain level controlled Vital Signs Assessment: post-procedure vital signs reviewed and stable Respiratory status: spontaneous breathing, nonlabored ventilation, respiratory function stable and patient connected to nasal cannula oxygen Cardiovascular status: blood pressure returned to baseline and stable Postop Assessment: no apparent nausea or vomiting Anesthetic complications: no    Last Vitals:  Vitals:   07/08/18 0134 07/08/18 0542  BP: 124/67 131/72  Pulse: 70 70  Resp: 18 17  Temp: 36.7 C 37.1 C  SpO2: 98% 95%    Last Pain:  Vitals:   07/08/18 1345  TempSrc:   PainSc: Asleep                 Catalina Gravel

## 2018-07-08 NOTE — Consult Note (Signed)
Urology Consult Note   Requesting Attending Physician:  Edison Pace, Md, MD Service Providing Consult: Urology  Consulting Attending: Phebe Colla, MD   Reason for Consult: Obstructing stone at left UVJ  HPI: Maria Wall is seen in consultation for reasons noted above at the request of Ccs, Md, MD for evaluation of obstructing stone at left UVJ.  This is a 68 y.o. female with a history of kidney stones (no previous surgery) who was admitted for acute appendicitis and taken to the OR with general surgery on 07/07/18. On CT scan performed for appendicitis, she was noted to have an asymptomatic obstructing 1.5 cm stone at the left UVJ and severe hydronephrosis. She reports stone passage > 10 years ago, no prior surgery for kidney stones. Denies symptoms of a UTI, fevers, or left-sided flank pain.   Creatinine 0.6, urinalysis consistent with kidney stone, no concern for UTI.    Past Medical History: History reviewed. No pertinent past medical history.  Past Surgical History:  Appendectomy 07/07/18  Medication: Current Facility-Administered Medications  Medication Dose Route Frequency Provider Last Rate Last Dose  . 0.9 % NaCl with KCl 20 mEq/ L  infusion   Intravenous Continuous Coralie Keens, MD 100 mL/hr at 07/08/18 0358    . amLODipine (NORVASC) tablet 5 mg  5 mg Oral Daily Coralie Keens, MD      . diphenhydrAMINE (BENADRYL) 12.5 MG/5ML elixir 12.5 mg  12.5 mg Oral Q6H PRN Coralie Keens, MD       Or  . diphenhydrAMINE (BENADRYL) injection 12.5 mg  12.5 mg Intravenous Q6H PRN Coralie Keens, MD      . enoxaparin (LOVENOX) injection 40 mg  40 mg Subcutaneous Q24H Coralie Keens, MD      . fentaNYL (SUBLIMAZE) 100 MCG/2ML injection           . ketorolac (TORADOL) 15 MG/ML injection 15 mg  15 mg Intravenous Q6H PRN Coralie Keens, MD      . morphine 2 MG/ML injection 1-4 mg  1-4 mg Intravenous Q1H PRN Coralie Keens, MD   4 mg at 07/08/18 0553  . ondansetron (ZOFRAN-ODT)  disintegrating tablet 4 mg  4 mg Oral Q6H PRN Coralie Keens, MD       Or  . ondansetron [facility]) injection 4 mg  4 mg Intravenous Q6H PRN Coralie Keens, MD      . oxyCODONE (Oxy IR/ROXICODONE) immediate release tablet 5-10 mg  5-10 mg Oral Q4H PRN Coralie Keens, MD      . traMADol Veatrice Bourbon) tablet 50 mg  50 mg Oral Q6H PRN Coralie Keens, MD        Allergies: No Known Allergies  Social History: Social History   Tobacco Use  . Smoking status: Never Smoker  . Smokeless tobacco: Never Used  Substance Use Topics  . Alcohol use: No  . Drug use: No    Family History Family History  Problem Relation Age of Onset  . Hypertension Mother   . Osteoporosis Mother   . Stroke Mother   . Alzheimer's disease Father   . Heart disease Father   . Hypertension Father   . Asthma Brother   . Depression Maternal Grandfather   . Breast cancer Neg Hx     Review of Systems 10 systems were reviewed and are negative except as noted specifically in the HPI.  Objective   Vital signs in last 24 hours: BP 131/72 (BP Location: Left Arm)   Pulse 70   Temp 98.7 F (37.1 C) (Oral)  Resp 17   Ht 5\' 4"  (1.626 m)   Wt 76.7 kg   SpO2 95%   BMI 29.01 kg/m   Physical Exam General: NAD, A&O, resting, appropriate HEENT: [place]/AT, EOMI, MMM Pulmonary: Normal work of breathing Cardiovascular: HDS, adequate peripheral perfusion Abdomen: Soft, nondistended, incisions c/d/i, no left sided TTP, mild right TTP. GU: no CVA tenderness Extremities: warm and well perfused Neuro: Appropriate, no focal neurological deficits  Most Recent Labs: Lab Results  Component Value Date   WBC 9.8 07/07/2018   HGB 14.4 07/07/2018   HCT 44.4 07/07/2018   PLT 199 07/07/2018    Lab Results  Component Value Date   NA 135 07/07/2018   K 3.7 07/07/2018   CL 102 07/07/2018   CO2 24 07/07/2018   BUN 8 07/07/2018   CREATININE 0.55 07/07/2018   CALCIUM 11.4 (H) 07/07/2018    No results found for: INR,  APTT   IMAGING: Ct Abdomen Pelvis W Contrast  Result Date: 07/07/2018 CLINICAL DATA:  Right lower quadrant abdominal pain. EXAM: CT ABDOMEN AND PELVIS WITH CONTRAST TECHNIQUE: Multidetector CT imaging of the abdomen and pelvis was performed using the standard protocol following bolus administration of intravenous contrast. CONTRAST:  155mL OMNIPAQUE IOHEXOL 300 MG/ML SOLN, 21mL OMNIPAQUE IOHEXOL 300 MG/ML SOLN COMPARISON:  None. FINDINGS: Lower chest: Slight elevation of the right hemidiaphragm. Otherwise normal. Hepatobiliary: Scattered small hepatic cysts. No worrisome liver lesion. Biliary tree appears normal. Pancreas: Unremarkable. No pancreatic ductal dilatation or surrounding inflammatory changes. Spleen: Normal in size without focal abnormality. Adrenals/Urinary Tract: Marked left hydronephrosis. The ureter is markedly dilated to the level 15 mm stone at the left ureterovesical junction. There is a 3 mm stone in the dilated left ureter proximal to this large stone. Right kidney is normal. Adrenal glands are normal. Bladder is normal. Stomach/Bowel: There is acute appendicitis. The appendix is dilated 14 mm with periappendiceal soft tissue stranding. There is also prominent mucosal thickening of the tip of the cecum. There is slight mucosal thickening of the adjacent terminal ileum. Vascular/Lymphatic: Aortic atherosclerosis. No enlarged abdominal or pelvic lymph nodes. Reproductive: Ovaries appear normal.  Fibroids in the uterus. Other: Small amount of free fluid in the pelvic cul-de-sac. No free air. No abdominal wall hernia. Musculoskeletal: Degenerative changes in the lumbar spine. No acute bone abnormality. IMPRESSION: 1. Acute appendicitis with inflammation of the adjacent tip of the cecum and terminal ileum. 2. Chronic left hydronephrosis due to obstruction of the distal left ureter by a 15 mm stone. 3 mm stone in the left ureter proximal to the large stone. Electronically Signed   By: Lorriane Shire M.D.   On: 07/07/2018 15:15    ------  Assessment:  68 y.o. female with an obstructing 1.5 cm stone at the left UVJ and an additional small stone just proximal with severe hydronephrosis. No AKI, no signs or symptoms of a UTI, and no left sided flank pain.   Recommendations: Given lack of pain, AKI, or UTI, no urgent urologic intervention required. However, given hydronephrosis and concern for chronic kidney damage, we do recommend outpatient stone management after she recovers from her appendectomy. Discussed management options and recommended left ureteroscopic stone extraction with laser lithotripsy. Patient is in agreement and would like to proceed. [facility] will arrange definitive stone surgery in approximately 3 weeks.    Thank you for this consult. Please contact the urology consult pager with any further questions/concerns.

## 2018-07-08 NOTE — Discharge Summary (Signed)
Physician Discharge Summary  Patient ID: Maria Wall MRN: [id number] DOB/AGE: 1950-04-03 68 y.o.  Admit date: 07/07/2018 Discharge date: 07/08/2018  Admission Diagnoses:  Discharge Diagnoses:  Active Problems:   Acute appendicitis obstructing ureteral stone  Discharged Condition: good  Hospital Course: admitted with appendicitis.  Taken to OR for appendectomy.  Uneventful post op recovery.  Discharged home POD#1 after Urology consult  Consults: urology  Significant Diagnostic Studies:   Treatments: surgery: laparoscopic appendectomy  Discharge Exam: Blood pressure 131/72, pulse 70, temperature 98.7 F (37.1 C), temperature source Oral, resp. rate 17, height 5\' 4"  (1.626 m), weight 76.7 kg, SpO2 95 %. General appearance: alert, cooperative and no distress Resp: clear to auscultation bilaterally Cardio: regular rate and rhythm, S1, S2 normal, no murmur, click, rub or gallop Incision/Wound:abdomen soft, incisions clean  Disposition: Discharge disposition: 01-Home or Self Care        Allergies as of 07/08/2018   No Known Allergies     Medication List    STOP taking these medications   hydrocortisone 2.5 % ointment     TAKE these medications   amLODipine 5 MG tablet Commonly known as:  NORVASC Take 1 tablet by mouth once daily What changed:  when to take this   amoxicillin-clavulanate 875-125 MG tablet Commonly known as:  Augmentin Take 1 tablet by mouth 2 (two) times daily for 7 days.   ELDERBERRY PO Take 5 mLs by mouth daily.   MAGNESIUM PO Take 1 tablet by mouth daily.   MULTIVITAMIN ADULT PO Take 1 tablet by mouth daily.   Omega-3 1000 MG Caps Take 1,000 mg by mouth daily.   oxyCODONE 5 MG immediate release tablet Commonly known as:  Oxy IR/ROXICODONE Take 1 tablet (5 mg total) by mouth every 6 (six) hours as needed for moderate pain or severe pain.   vitamin B-12 1000 MCG tablet Commonly known as:  CYANOCOBALAMIN Take 1,000 mcg by mouth daily.    vitamin C 500 MG tablet Commonly known as:  ASCORBIC ACID Take 500 mg by mouth daily.   VITAMIN D3 PO Take 1 tablet by mouth daily.      Follow-up Information    Coralie Keens, MD. Schedule an appointment as soon as possible for a visit in 3 week(s).   Specialty:  General Surgery Contact information: [place] [address] [phone]           Signed: Coralie Keens 07/08/2018, 7:32 AM

## 2018-07-09 ENCOUNTER — Encounter (HOSPITAL_COMMUNITY): Payer: Self-pay | Admitting: Surgery

## 2018-07-10 ENCOUNTER — Other Ambulatory Visit: Payer: Self-pay | Admitting: Urology

## 2018-07-11 ENCOUNTER — Encounter: Payer: Self-pay | Admitting: Family Medicine

## 2018-07-22 ENCOUNTER — Other Ambulatory Visit (HOSPITAL_COMMUNITY)
Admission: RE | Admit: 2018-07-22 | Discharge: 2018-07-22 | Disposition: A | Discharge status: Home / Self Care | Payer: Medicare Other | Source: Ambulatory Visit | Attending: Urology | Admitting: Urology

## 2018-07-22 DIAGNOSIS — Z1159 Encounter for screening for other viral diseases: Secondary | ICD-10-CM | POA: Insufficient documentation

## 2018-07-23 LAB — SARS CORONAVIRUS 2 (TAT 6-24 HRS): SARS Coronavirus 2: NEGATIVE

## 2018-07-24 ENCOUNTER — Encounter (HOSPITAL_BASED_OUTPATIENT_CLINIC_OR_DEPARTMENT_OTHER): Payer: Self-pay | Admitting: *Deleted

## 2018-07-24 ENCOUNTER — Other Ambulatory Visit: Payer: Self-pay

## 2018-07-24 NOTE — Progress Notes (Signed)
SPOKE W/  _ pt via phone     [place] 19:   COUGH-- no  RUNNY NOSE---  no  SORE THROAT--- no  NASAL CONGESTION---- no  SNEEZING---- no  SHORTNESS OF BREATH--- no  DIFFICULTY BREATHING--- no  TEMP >100.0 ----- no  UNEXPLAINED BODY ACHES------ no  CHILLS --------  no  HEADACHES --------- no  LOSS OF SMELL/ TASTE -------- ni    HAVE YOU OR ANY FAMILY MEMBER TRAVELLED PAST 14 DAYS OUT OF THE   COUNTY--- no STATE---- no COUNTRY---- no  HAVE YOU OR ANY FAMILY MEMBER BEEN EXPOSED TO ANYONE WITH COVID 19?   denies

## 2018-07-24 NOTE — Progress Notes (Signed)
Spoke w/ pt via phone for pre-op interview.  Npo after mn.  Arrive at 0830.  Needs istat 8 and ekg.  Will take norvasc am dos w/ sips of water.  Pt had covid test done 07-22-2018.

## 2018-07-25 MED ORDER — GENTAMICIN SULFATE 40 MG/ML IJ SOLN
5.0000 mg/kg | INTRAVENOUS | Status: DC
  Filled 2018-07-25: qty 8

## 2018-07-26 ENCOUNTER — Encounter (HOSPITAL_BASED_OUTPATIENT_CLINIC_OR_DEPARTMENT_OTHER): Payer: Self-pay

## 2018-07-26 ENCOUNTER — Ambulatory Visit (HOSPITAL_BASED_OUTPATIENT_CLINIC_OR_DEPARTMENT_OTHER): Payer: Medicare Other | Admitting: Anesthesiology

## 2018-07-26 ENCOUNTER — Encounter (HOSPITAL_BASED_OUTPATIENT_CLINIC_OR_DEPARTMENT_OTHER)
Admission: RE | Disposition: A | Discharge status: Home / Self Care | Payer: Self-pay | Source: Home / Self Care | Attending: Urology

## 2018-07-26 ENCOUNTER — Encounter: Payer: Self-pay | Admitting: Family Medicine

## 2018-07-26 ENCOUNTER — Ambulatory Visit (HOSPITAL_BASED_OUTPATIENT_CLINIC_OR_DEPARTMENT_OTHER)
Admission: RE | Admit: 2018-07-26 | Discharge: 2018-07-26 | Disposition: A | Discharge status: Home / Self Care | Payer: Medicare Other | Attending: Urology | Admitting: Urology

## 2018-07-26 DIAGNOSIS — Z79899 Other long term (current) drug therapy: Secondary | ICD-10-CM | POA: Insufficient documentation

## 2018-07-26 DIAGNOSIS — N132 Hydronephrosis with renal and ureteral calculous obstruction: Secondary | ICD-10-CM | POA: Diagnosis not present

## 2018-07-26 DIAGNOSIS — I10 Essential (primary) hypertension: Secondary | ICD-10-CM | POA: Insufficient documentation

## 2018-07-26 DIAGNOSIS — N201 Calculus of ureter: Secondary | ICD-10-CM | POA: Diagnosis present

## 2018-07-26 DIAGNOSIS — N200 Calculus of kidney: Secondary | ICD-10-CM | POA: Insufficient documentation

## 2018-07-26 HISTORY — DX: Essential (primary) hypertension: I10

## 2018-07-26 HISTORY — DX: Presence of spectacles and contact lenses: Z97.3

## 2018-07-26 HISTORY — DX: Calculus of ureter: N20.1

## 2018-07-26 HISTORY — PX: HOLMIUM LASER APPLICATION: SHX5852

## 2018-07-26 HISTORY — PX: CYSTOSCOPY WITH RETROGRADE PYELOGRAM, URETEROSCOPY AND STENT PLACEMENT: SHX5789

## 2018-07-26 LAB — POCT I-STAT, CHEM 8
BUN: 15 mg/dL (ref 8–23)
Calcium, Ion: 1.62 mmol/L (ref 1.15–1.40)
Chloride: 107 mmol/L (ref 98–111)
Creatinine, Ser: 0.6 mg/dL (ref 0.44–1.00)
Glucose, Bld: 97 mg/dL (ref 70–99)
HCT: 43 % (ref 36.0–46.0)
Hemoglobin: 14.6 g/dL (ref 12.0–15.0)
Potassium: 4.9 mmol/L (ref 3.5–5.1)
Sodium: 140 mmol/L (ref 135–145)
TCO2: 30 mmol/L (ref 22–32)

## 2018-07-26 SURGERY — CYSTOURETEROSCOPY, WITH RETROGRADE PYELOGRAM AND STENT INSERTION
Anesthesia: General | Laterality: Left

## 2018-07-26 MED ORDER — FENTANYL CITRATE (PF) 100 MCG/2ML IJ SOLN
INTRAMUSCULAR | Status: AC
  Filled 2018-07-26: qty 2

## 2018-07-26 MED ORDER — LACTATED RINGERS IV SOLN
INTRAVENOUS | Status: DC
  Administered 2018-07-26: 09:00:00 via INTRAVENOUS
  Filled 2018-07-26: qty 1000

## 2018-07-26 MED ORDER — CEPHALEXIN 500 MG PO CAPS: 500.0000 mg | ORAL_CAPSULE | Freq: Two times a day (BID) | ORAL | 0 refills | Status: DC

## 2018-07-26 MED ORDER — GENTAMICIN SULFATE 40 MG/ML IJ SOLN
5.0000 mg/kg | INTRAVENOUS | Status: AC
  Administered 2018-07-26: 320 mg via INTRAVENOUS
  Filled 2018-07-26: qty 8

## 2018-07-26 MED ORDER — MIDAZOLAM HCL 2 MG/2ML IJ SOLN
INTRAMUSCULAR | Status: DC | PRN
  Administered 2018-07-26: 2 mg via INTRAVENOUS

## 2018-07-26 MED ORDER — PROMETHAZINE HCL 25 MG/ML IJ SOLN
6.2500 mg | INTRAMUSCULAR | Status: DC | PRN
  Filled 2018-07-26: qty 1

## 2018-07-26 MED ORDER — PROPOFOL 10 MG/ML IV BOLUS
INTRAVENOUS | Status: DC | PRN
  Administered 2018-07-26: 150 mg via INTRAVENOUS

## 2018-07-26 MED ORDER — KETOROLAC TROMETHAMINE 30 MG/ML IJ SOLN
INTRAMUSCULAR | Status: AC
  Filled 2018-07-26: qty 1

## 2018-07-26 MED ORDER — LIDOCAINE 2% (20 MG/ML) 5 ML SYRINGE
INTRAMUSCULAR | Status: AC
  Filled 2018-07-26: qty 5

## 2018-07-26 MED ORDER — MIDAZOLAM HCL 2 MG/2ML IJ SOLN
INTRAMUSCULAR | Status: AC
  Filled 2018-07-26: qty 2

## 2018-07-26 MED ORDER — EPHEDRINE 5 MG/ML INJ
INTRAVENOUS | Status: AC
  Filled 2018-07-26: qty 10

## 2018-07-26 MED ORDER — EPHEDRINE SULFATE-NACL 50-0.9 MG/10ML-% IV SOSY
PREFILLED_SYRINGE | INTRAVENOUS | Status: DC | PRN
  Administered 2018-07-26: 10 mg via INTRAVENOUS

## 2018-07-26 MED ORDER — FENTANYL CITRATE (PF) 100 MCG/2ML IJ SOLN
INTRAMUSCULAR | Status: DC | PRN
  Administered 2018-07-26: 25 ug via INTRAVENOUS
  Administered 2018-07-26: 50 ug via INTRAVENOUS
  Administered 2018-07-26: 25 ug via INTRAVENOUS

## 2018-07-26 MED ORDER — ONDANSETRON HCL 4 MG/2ML IJ SOLN
INTRAMUSCULAR | Status: DC | PRN
  Administered 2018-07-26: 4 mg via INTRAVENOUS

## 2018-07-26 MED ORDER — KETOROLAC TROMETHAMINE 30 MG/ML IJ SOLN
INTRAMUSCULAR | Status: DC | PRN
  Administered 2018-07-26: 30 mg via INTRAVENOUS

## 2018-07-26 MED ORDER — DEXAMETHASONE SODIUM PHOSPHATE 10 MG/ML IJ SOLN
INTRAMUSCULAR | Status: AC
  Filled 2018-07-26: qty 1

## 2018-07-26 MED ORDER — PROPOFOL 10 MG/ML IV BOLUS
INTRAVENOUS | Status: AC
  Filled 2018-07-26: qty 20

## 2018-07-26 MED ORDER — LIDOCAINE 2% (20 MG/ML) 5 ML SYRINGE
INTRAMUSCULAR | Status: DC | PRN
  Administered 2018-07-26: 60 mg via INTRAVENOUS

## 2018-07-26 MED ORDER — KETOROLAC TROMETHAMINE 10 MG PO TABS: 10.0000 mg | ORAL_TABLET | Freq: Four times a day (QID) | ORAL | 0 refills | Status: DC | PRN

## 2018-07-26 MED ORDER — OXYCODONE-ACETAMINOPHEN 5-325 MG PO TABS: 1.0000 | ORAL_TABLET | Freq: Four times a day (QID) | ORAL | 0 refills | Status: DC | PRN

## 2018-07-26 MED ORDER — ONDANSETRON HCL 4 MG/2ML IJ SOLN
INTRAMUSCULAR | Status: AC
  Filled 2018-07-26: qty 2

## 2018-07-26 MED ORDER — IOHEXOL 300 MG/ML  SOLN
INTRAMUSCULAR | Status: DC | PRN
  Administered 2018-07-26: 10:00:00 25 mL via URETHRAL

## 2018-07-26 MED ORDER — FENTANYL CITRATE (PF) 100 MCG/2ML IJ SOLN
25.0000 ug | INTRAMUSCULAR | Status: DC | PRN
  Filled 2018-07-26: qty 1

## 2018-07-26 MED ORDER — SENNOSIDES-DOCUSATE SODIUM 8.6-50 MG PO TABS: 1.0000 | ORAL_TABLET | Freq: Two times a day (BID) | ORAL | 0 refills | Status: DC

## 2018-07-26 MED ORDER — DEXAMETHASONE SODIUM PHOSPHATE 10 MG/ML IJ SOLN
INTRAMUSCULAR | Status: DC | PRN
  Administered 2018-07-26: 5 mg via INTRAVENOUS

## 2018-07-26 SURGICAL SUPPLY — 24 items
BAG DRAIN URO-CYSTO SKYTR STRL (DRAIN) ×3 IMPLANT
BASKET LASER NITINOL 1.9FR (BASKET) ×3 IMPLANT
CATH INTERMIT  6FR 70CM (CATHETERS) ×3 IMPLANT
CLOTH BEACON ORANGE TIMEOUT ST (SAFETY) ×3 IMPLANT
FIBER LASER FLEXIVA 365 (UROLOGICAL SUPPLIES) IMPLANT
FIBER LASER TRAC TIP (UROLOGICAL SUPPLIES) ×3 IMPLANT
GLOVE BIO SURGEON STRL SZ7.5 (GLOVE) ×3 IMPLANT
GOWN STRL REUS W/TWL LRG LVL3 (GOWN DISPOSABLE) ×3 IMPLANT
GUIDEWIRE ANG ZIPWIRE 038X150 (WIRE) ×3 IMPLANT
GUIDEWIRE STR DUAL SENSOR (WIRE) ×3 IMPLANT
IV NS 1000ML (IV SOLUTION) ×2
IV NS 1000ML BAXH (IV SOLUTION) ×1 IMPLANT
IV NS IRRIG 3000ML ARTHROMATIC (IV SOLUTION) ×3 IMPLANT
KIT TURNOVER CYSTO (KITS) ×3 IMPLANT
MANIFOLD NEPTUNE II (INSTRUMENTS) ×3 IMPLANT
NS IRRIG 500ML POUR BTL (IV SOLUTION) ×6 IMPLANT
PACK CYSTO (CUSTOM PROCEDURE TRAY) ×3 IMPLANT
STENT POLARIS LOOP 6FR X 26 CM (STENTS) IMPLANT
STENT URET 6FRX28 CONTOUR (STENTS) ×3 IMPLANT
SYR 10ML LL (SYRINGE) ×3 IMPLANT
TUBE CONNECTING 12'X1/4 (SUCTIONS) ×1
TUBE CONNECTING 12X1/4 (SUCTIONS) ×2 IMPLANT
TUBE FEEDING 8FR 16IN STR KANG (MISCELLANEOUS) ×3 IMPLANT
TUBING UROLOGY SET (TUBING) ×3 IMPLANT

## 2018-07-26 NOTE — Progress Notes (Signed)
Received report as caregiver.  Pt still w c/o of feeling light headed.  Pt denies pain or nausea. Pt reminded that anesthesia is still present , thus the recommendation to have a responsible person w her x 24 hrs.  D/c instructions reviewed w pt and spouse. Vss. colr good, skin warm and dry.  Pt tolerating po's well.  Pt encouraged to go home, take anp to allow more of the anesthesia wear off.  If she continues to have concerns to call Dr. Zettie Pho office. Pt and spouse verbalized their understanding.

## 2018-07-26 NOTE — H&P (Signed)
Urology Admission H&P  Chief Complaint: Pre-op LEFT Ureteroscopic Stone Manipulation  History of Present Illness:   1 - Left Distal Ureteral Stone - 1.5 cm left distal stone with mod hydro incidetnal on ER CT 07/2018 on eval appendicitis since treated. Stone is solitary, 1200HU with preserved ipsilateral renal parenchyma. UCX negative.   Today "Maria Wall" is seen to proceed with LEFT ureteroscopic stone manipulaiton for chronic appearing stone with likely at least partial obstruction. No interval fevers.    Past Medical History:  Diagnosis Date  . Hypertension   . Left ureteral stone   . Wears glasses    Past Surgical History:  Procedure Laterality Date  . LAPAROSCOPIC APPENDECTOMY N/A 07/07/2018   Procedure: APPENDECTOMY LAPAROSCOPIC;  Surgeon: Coralie Keens, MD;  Location: [facility];  Service: General;  Laterality: N/A;    Home Medications:  Current Facility-Administered Medications  Medication Dose Route Frequency Provider Last Rate Last Dose  . gentamicin (GARAMYCIN) 320 mg in dextrose 5 % 100 mL IVPB  5 mg/kg (Adjusted) Intravenous 30 min Pre-Op Alexis Frock, MD       Current Outpatient Medications  Medication Sig Dispense Refill Last Dose  . Cholecalciferol (VITAMIN D3 PO) Take 1 tablet by mouth daily.     Marland Kitchen ELDERBERRY PO Take 5 mLs by mouth daily.     Marland Kitchen MAGNESIUM PO Take 1 tablet by mouth daily.     . Multiple Vitamins-Minerals (MULTIVITAMIN ADULT PO) Take 1 tablet by mouth daily.     . Omega-3 1000 MG CAPS Take 1,000 mg by mouth daily.     . vitamin B-12 (CYANOCOBALAMIN) 1000 MCG tablet Take 1,000 mcg by mouth daily.     . vitamin C (ASCORBIC ACID) 500 MG tablet Take 500 mg by mouth daily.     Marland Kitchen amLODipine (NORVASC) 5 MG tablet Take 1 tablet by mouth once daily (Patient taking differently: Take 5 mg by mouth daily after breakfast. ) 90 tablet 0    Allergies: No Known Allergies  Family History  Problem Relation Age of Onset  . Hypertension Mother   . Osteoporosis Mother    . Stroke Mother   . Alzheimer's disease Father   . Heart disease Father   . Hypertension Father   . Asthma Brother   . Depression Maternal Grandfather   . Breast cancer Neg Hx    Social History:  reports that she has never smoked. She has never used smokeless tobacco. She reports that she does not drink alcohol or use drugs.  Review of Systems  Constitutional: Negative for chills and fever.  Genitourinary: Negative for flank pain and hematuria.  All other systems reviewed and are negative.   Physical Exam:  Vital signs in last 24 hours:   Physical Exam  Constitutional: She appears well-developed.  HENT:  Head: Normocephalic.  Eyes: Pupils are equal, round, and reactive to light.  Neck: Normal range of motion.  Respiratory: Effort normal.  GI: Soft.  Mild obesity, recnet laparosopic sites c/d/i, no hernias.   Genitourinary:    Genitourinary Comments: NO CVAT at present.    Musculoskeletal: Normal range of motion.  Neurological: She is alert.  Skin: Skin is warm.  Psychiatric: She has a normal mood and affect.    Laboratory Data:  No results found for this or any previous visit (from the past 24 hour(s)). Recent Results (from the past 240 hour(s))  SARS Coronavirus 2 (Performed in [facility] hospital lab)     Status: None   Collection Time: 07/22/18  11:37 AM   Specimen: Nasal Swab  Result Value Ref Range Status   SARS Coronavirus 2 NEGATIVE NEGATIVE Final    Comment: (NOTE) SARS-CoV-2 target nucleic acids are NOT DETECTED. The SARS-CoV-2 RNA is generally detectable in upper and lower respiratory specimens during the acute phase of infection. Negative results do not preclude SARS-CoV-2 infection, do not rule out co-infections with other pathogens, and should not be used as the sole basis for treatment or other patient management decisions. Negative results must be combined with clinical observations, patient history, and epidemiological information. The expected  result is Negative. Fact Sheet for Patients: TrashEliminator.se Fact Sheet for Healthcare Providers: WhoisBlogging.ch This test is not yet approved or cleared by the [place] FDA and  has been authorized for detection and/or diagnosis of SARS-CoV-2 by FDA under an Emergency Use Authorization (EUA). This EUA will remain  in effect (meaning this test can be used) for the duration of the COVID-19 declaration under Section 56 4(b)(1) of the Act, 21 U.S.C. section 360bbb-3(b)(1), unless the authorization is terminated or revoked sooner. Performed at [facility], [place] [address], [place], [place] [address]      Plan:  Proceed as planned with LEFT ureteroscopy with goal of left stone free. Risks, benefits, alternatives, expected peri-op course discussed previously and reiterated today.  Alexis Frock 07/26/2018, 8:17 AM

## 2018-07-26 NOTE — Anesthesia Preprocedure Evaluation (Signed)
Anesthesia Evaluation  Patient identified by MRN, date of birth, ID band Patient awake    Reviewed: Allergy & Precautions, NPO status , Patient's Chart, lab work & pertinent test results  Airway Mallampati: II  TM Distance: >3 FB Neck ROM: Full    Dental  (+) Teeth Intact, Dental Advisory Given, Caps   Pulmonary neg pulmonary ROS,    Pulmonary exam normal breath sounds clear to auscultation       Cardiovascular hypertension, Pt. on medications Normal cardiovascular exam Rhythm:Regular Rate:Normal     Neuro/Psych negative neurological ROS     GI/Hepatic Neg liver ROS, Acute appendicitis   Endo/Other  negative endocrine ROS  Renal/GU negative Renal ROS     Musculoskeletal negative musculoskeletal ROS (+)   Abdominal   Peds  Hematology negative hematology ROS (+)   Anesthesia Other Findings Day of surgery medications reviewed with the patient.  Reproductive/Obstetrics                             Anesthesia Physical  Anesthesia Plan  ASA: II  Anesthesia Plan: General   Post-op Pain Management:    Induction: Intravenous  PONV Risk Score and Plan: 4 or greater and Dexamethasone, Ondansetron and Treatment may vary due to age or medical condition  Airway Management Planned: LMA  Additional Equipment:   Intra-op Plan:   Post-operative Plan: Extubation in OR  Informed Consent: I have reviewed the patients History and Physical, chart, labs and discussed the procedure including the risks, benefits and alternatives for the proposed anesthesia with the patient or authorized representative who has indicated his/her understanding and acceptance.     Dental advisory given  Plan Discussed with: CRNA  Anesthesia Plan Comments:         Anesthesia Quick Evaluation

## 2018-07-26 NOTE — Anesthesia Postprocedure Evaluation (Signed)
Anesthesia Post Note  Patient: [facility]  Procedure(s) Performed: CYSTOSCOPY WITH RETROGRADE PYELOGRAM, URETEROSCOPY AND STENT PLACEMENT (Left ) HOLMIUM LASER APPLICATION (Left )     Patient location during evaluation: PACU Anesthesia Type: General Level of consciousness: awake and alert Pain management: pain level controlled Vital Signs Assessment: post-procedure vital signs reviewed and stable Respiratory status: spontaneous breathing, nonlabored ventilation, respiratory function stable and patient connected to nasal cannula oxygen Cardiovascular status: blood pressure returned to baseline and stable Postop Assessment: no apparent nausea or vomiting Anesthetic complications: no    Last Vitals:  Vitals:   07/26/18 1215 07/26/18 1245  BP: 129/68 136/71  Pulse: 61 66  Resp: 16 15  Temp:  36.5 C  SpO2: 99% 98%    Last Pain:  Vitals:   07/26/18 1245  TempSrc:   PainSc: 0-No pain                 Tiajuana Amass

## 2018-07-26 NOTE — Discharge Instructions (Signed)
1 - You may have urinary urgency (bladder spasms) and bloody urine on / off with stent in place. This is normal.  2 - Call MD or go to ER for fever >102, severe pain / nausea / vomiting not relieved by medications, or acute change in medical status    [facility] [phone] Post Ureteroscopy With or Without Stent Instructions  Definitions:  Ureter: The duct that transports urine from the kidney to the bladder. Stent:   A plastic hollow tube that is placed into the ureter, from the kidney to the                 bladder to prevent the ureter from swelling shut.  GENERAL INSTRUCTIONS:  Despite the fact that no skin incisions were used, the area around the ureter and bladder is raw and irritated. The stent is a foreign body which will further irritate the bladder wall. This irritation is manifested by increased frequency of urination, both day and night, and by an increase in the urge to urinate. In some, the urge to urinate is present almost always. Sometimes the urge is strong enough that you may not be able to stop yourself from urinating. The only real cure is to remove the stent and then give time for the bladder wall to heal which can't be done until the danger of the ureter swelling shut has passed, which varies.  You may see some blood in your urine while the stent is in place and a few days afterwards. Do not be alarmed, even if the urine was clear for a while. Get off your feet and drink lots of fluids until clearing occurs. If you start to pass clots or don't improve, call us.  DIET: You may return to your normal diet immediately. Because of the raw surface of your bladder, alcohol, spicy foods, acid type foods and drinks with caffeine may cause irritation or frequency and should be used in moderation. To keep your urine flowing freely and to avoid constipation, drink plenty of fluids during the day ( 8-10 glasses ). Tip: Avoid cranberry juice because it is very  acidic.  ACTIVITY: Your physical activity doesn't need to be restricted. However, if you are very active, you may see some blood in your urine. We suggest that you reduce your activity under these circumstances until the bleeding has stopped.  BOWELS: It is important to keep your bowels regular during the postoperative period. Straining with bowel movements can cause bleeding. A bowel movement every other day is reasonable. Use a mild laxative if needed, such as Milk of Magnesia 2-3 tablespoons, or 2 Dulcolax tablets. Call if you continue to have problems. If you have been taking narcotics for pain, before, during or after your surgery, you may be constipated. Take a laxative if necessary.   MEDICATION: You should resume your pre-surgery medications unless told not to. In addition you will often be given an antibiotic to prevent infection. These should be taken as prescribed until the bottles are finished unless you are having an unusual reaction to one of the drugs.  PROBLEMS YOU SHOULD REPORT TO [place]:  Fevers over 100.5 Fahrenheit.  Heavy bleeding, or clots ( See above notes about blood in urine ).  Inability to urinate.  Drug reactions ( hives, rash, nausea, vomiting, diarrhea ).  Severe burning or pain with urination that is not improving.  FOLLOW-UP: You will need a follow-up appointment to monitor your progress. Call for this appointment at the  number listed above. Usually the first appointment will be about three to fourteen days after your surgery.    Post Anesthesia Home Care Instructions  Activity: Get plenty of rest for the remainder of the day. A responsible individual must stay with you for 24 hours following the procedure.  For the next 24 hours, DO NOT: -Drive a car -Paediatric nurse -Drink alcoholic beverages -Take any medication unless instructed by your physician -Make any legal decisions or sign important papers.  Meals: Start with liquid foods such as  gelatin or soup. Progress to regular foods as tolerated. Avoid greasy, spicy, heavy foods. If nausea and/or vomiting occur, drink only clear liquids until the nausea and/or vomiting subsides. Call your physician if vomiting continues.  Special Instructions/Symptoms: Your throat may feel dry or sore from the anesthesia or the breathing tube placed in your throat during surgery. If this causes discomfort, gargle with warm salt water. The discomfort should disappear within 24 hours.  If you had a scopolamine patch placed behind your ear for the management of post- operative nausea and/or vomiting:  1. The medication in the patch is effective for 72 hours, after which it should be removed.  Wrap patch in a tissue and discard in the trash. Wash hands thoroughly with soap and water. 2. You may remove the patch earlier than 72 hours if you experience unpleasant side effects which may include dry mouth, dizziness or visual disturbances. 3. Avoid touching the patch. Wash your hands with soap and water after contact with the patch.     NO IBUPROFEN PRODUCTS (MOTRIN, ADVIL) OR ALEVE UNTIL 5:15PM TODAY.

## 2018-07-26 NOTE — Op Note (Signed)
Maria Wall, ABBETT MEDICAL RECORD [id number] ACCOUNT [id number] DATE OF BIRTH:December 21, 1950 FACILITY: [facility] LOCATION: [facility] PHYSICIAN:Korrie Hofbauer Tresa Moore, MD  OPERATIVE REPORT  DATE OF PROCEDURE:  07/26/2018  PREOPERATIVE DIAGNOSIS:  Large left distal ureteral stone.  PROCEDURE: 1.  Cystoscopy, left retrograde pyelogram, interpretation. 2.  Left ureteroscopy, laser lithotripsy. 3.  Insertion of left ureteral stent 6 x 28 Contour, no tether.  ESTIMATED BLOOD LOSS:  Nil.  COMPLICATIONS:  None.  SPECIMENS:  Left ureteral stone fragments for comp analysis.  FINDINGS: 1.  A very large impacted left distal ureteral stone. 2.  Severe hydroureteronephrosis. 3.  Complete resolution of all accessible stone fragments larger than 130 mm on the left side. 4.  Left ureteral stent placement, proximal end in proximal ureter, distal end in urinary bladder.  INDICATIONS:  The patient is a pleasant 68 year old lady who was found incidentally on workup of appendicitis to have a very large left distal ureteral stone with moderate to severe hydronephrosis.  Amazingly, the stone was quite asymptomatic at the  time.  However, given signs of significant obstruction, it was clearly felt that renal decompression and treating at this time was warranted to preserve ipsilateral renal function.  She has recovered well from her appendectomy and now presents for  elective management of a left ureteral stent.  Informed consent was obtained and placed in the medical record.  PROCEDURE IN DETAIL:  The patient being identified and the procedure being left ureteroscopic stone manipulation was confirmed.  Procedure timeout was performed.  Intravenous antibiotics were administered.  General anesthesia was induced.  The patient  was placed into a low lithotomy position.  A sterile field was created, prepping and draping the patient's vagina, introitus and proximal thighs using iodine.  Cystourethroscopy was performed using  a 21-French rigid cystoscope with offset lens.   Inspection of urinary bladder revealed no diverticula, calcifications, or papillary lesions.  The left ureteral orifice was cannulated with a 6-French renal catheter, and left retrograde pyelogram was obtained.  Left retrograde pyelogram demonstrated a completely impacted left ureteral stone distally.  No contrast and was able to traverse proximal to this.  Attempt was made to obtain continuity using an angle-tipped ZIPwire and a straight tip Sensor wire.  This  was unsuccessful.  This was felt to likely represent complete impaction of the stone as expected.  As such, semi-rigid ureteroscopy was performed in the distal left ureter after an 8-French feeding tube was placed in the urinary bladder for pressure  release, and as expected, there was a very impacted left distal ureteral stone with some mucosalization over this.  This was much too large for simple basketing, and again the infection did not allow wire passage across it at this point.  As such,  holmium laser energy applied at a setting of 0.2 joules and 20 Hz, and very carefully using a dusting technique, approximately 50% of the stone volume was ablated.  At this point, there was continuity established and visually passed the stone above this.   A ZIPwire was advanced to the level of the upper pole and set aside as a safety wire.  Continued laser lithotripsy was then performed with increasing power settings up to 0.4 joules and 40 Hz, and again using a dusting technique, approximately 75% of  the stone was ablated using a dusting technique.  The remaining 25% was controlled using an escape basket, and all accessible stone fragments larger than 130 mm were carefully removed and set aside for composition analysis.  Semirigid ureteroscopy  was  performed of the distal four-fifths of the left ureter.  In the proximal ureter, there was significant tortuosity which would not allow inspection all the way to  the level of the renal pelvis.  Given the significant impaction, it was felt that stenting  with a nontethered stent would be warranted.  As such, a new 6 x 26 Polaris-type stent was placed over the remaining safety wire.  Given the significant hydronephrosis, this only coiled in the proximal ureter.  As such, it was exchanged for the longest  stent length available, which was a 6 x 28 contour stent.  This also coiled into the proximal ureter proximally, the urinary bladder distally.  This was felt to have achieved the goal of the stent, which was to traverse the area of prior impaction.   Decision was made to conclude the procedure with the stent in place.  Procedure was then terminated.  The patient tolerated the procedure well.  No immediate perioperative complications.  The patient was taken to postanesthesia care in stable condition  with plan for discharge home.  LN/NUANCE  D:07/26/2018 T:07/26/2018 JOB:[id number]

## 2018-07-26 NOTE — Brief Op Note (Signed)
07/26/2018  11:19 AM  PATIENT:  Maria Wall  68 y.o. female  PRE-OPERATIVE DIAGNOSIS:  LEFT URETERAL STONE  POST-OPERATIVE DIAGNOSIS:  LEFT URETERAL STONE  PROCEDURE:  Procedure(s) with comments: CYSTOSCOPY WITH RETROGRADE PYELOGRAM, URETEROSCOPY AND STENT PLACEMENT (Left) - 75 MINS HOLMIUM LASER APPLICATION (Left)  SURGEON:  Surgeon(s) and Role:    Alexis Frock, MD - Primary  PHYSICIAN ASSISTANT:   ASSISTANTS: none   ANESTHESIA:   general  EBL:  0 mL   BLOOD ADMINISTERED:none  DRAINS: none   LOCAL MEDICATIONS USED:  NONE  SPECIMEN:  Source of Specimen:  left ureteral stone fragments  DISPOSITION OF SPECIMEN:  [facility] for compositional analysis  COUNTS:  YES  TOURNIQUET:  * No tourniquets in log *  DICTATION: .Other Dictation: Dictation Number [id number]  PLAN OF CARE: Discharge to home after PACU  PATIENT DISPOSITION:  PACU - hemodynamically stable.   Delay start of Pharmacological VTE agent (>24hrs) due to surgical blood loss or risk of bleeding: yes

## 2018-07-26 NOTE — Transfer of Care (Signed)
Immediate Anesthesia Transfer of Care Note  Patient: [facility]  Procedure(s) Performed: Procedure(s) (LRB): CYSTOSCOPY WITH RETROGRADE PYELOGRAM, URETEROSCOPY AND STENT PLACEMENT (Left) HOLMIUM LASER APPLICATION (Left)  Patient Location: PACU  Anesthesia Type: General  Level of Consciousness: awake, oriented, sedated and patient cooperative  Airway & Oxygen Therapy: Patient Spontanous Breathing and Patient connected to face mask oxygen  Post-op Assessment: Report given to PACU RN and Post -op Vital signs reviewed and stable  Post vital signs: Reviewed and stable  Complications: No apparent anesthesia complications  Last Vitals:  Vitals Value Taken Time  BP 138/66 07/26/18 1130  Temp    Pulse 66 07/26/18 1131  Resp 14 07/26/18 1131  SpO2 100 % 07/26/18 1131  Vitals shown include unvalidated device data.  Last Pain:  Vitals:   07/26/18 0829  TempSrc: Oral  PainSc: 0-No pain      Patients Stated Pain Goal: 5 (07/26/18 0829)

## 2018-07-26 NOTE — Anesthesia Procedure Notes (Signed)
Procedure Name: LMA Insertion Date/Time: 07/26/2018 9:44 AM Performed by: Suan Halter, CRNA Pre-anesthesia Checklist: Patient identified, Emergency Drugs available, Suction available and Patient being monitored Patient Re-evaluated:Patient Re-evaluated prior to induction Oxygen Delivery Method: Circle system utilized Preoxygenation: Pre-oxygenation with 100% oxygen Induction Type: IV induction Ventilation: Mask ventilation without difficulty LMA: LMA inserted LMA Size: 4.0 Number of attempts: 1 Airway Equipment and Method: Bite block Placement Confirmation: positive ETCO2 Tube secured with: Tape Dental Injury: Teeth and Oropharynx as per pre-operative assessment

## 2018-07-27 ENCOUNTER — Encounter (HOSPITAL_BASED_OUTPATIENT_CLINIC_OR_DEPARTMENT_OTHER): Payer: Self-pay | Admitting: Urology

## 2018-07-31 ENCOUNTER — Other Ambulatory Visit: Payer: Self-pay | Admitting: Family Medicine

## 2018-11-06 ENCOUNTER — Other Ambulatory Visit: Payer: Self-pay | Admitting: Family Medicine

## 2018-11-06 DIAGNOSIS — Z1231 Encounter for screening mammogram for malignant neoplasm of breast: Secondary | ICD-10-CM

## 2018-11-10 ENCOUNTER — Other Ambulatory Visit: Payer: Self-pay | Admitting: Surgery

## 2018-11-10 DIAGNOSIS — E210 Primary hyperparathyroidism: Secondary | ICD-10-CM

## 2018-11-15 ENCOUNTER — Other Ambulatory Visit: Payer: Self-pay

## 2018-11-15 ENCOUNTER — Encounter: Payer: Self-pay | Admitting: Family Medicine

## 2018-11-15 ENCOUNTER — Other Ambulatory Visit (HOSPITAL_COMMUNITY): Payer: Self-pay | Admitting: Surgery

## 2018-11-15 ENCOUNTER — Other Ambulatory Visit: Payer: Self-pay | Admitting: Surgery

## 2018-11-15 ENCOUNTER — Ambulatory Visit (INDEPENDENT_AMBULATORY_CARE_PROVIDER_SITE_OTHER): Payer: Medicare Other | Admitting: Family Medicine

## 2018-11-15 VITALS — BP 132/72 | HR 64 | Ht 64.0 in | Wt 180.1 lb

## 2018-11-15 DIAGNOSIS — Z23 Encounter for immunization: Secondary | ICD-10-CM

## 2018-11-15 DIAGNOSIS — Z1211 Encounter for screening for malignant neoplasm of colon: Secondary | ICD-10-CM | POA: Diagnosis not present

## 2018-11-15 DIAGNOSIS — E210 Primary hyperparathyroidism: Secondary | ICD-10-CM

## 2018-11-15 DIAGNOSIS — Z1159 Encounter for screening for other viral diseases: Secondary | ICD-10-CM

## 2018-11-15 DIAGNOSIS — Z780 Asymptomatic menopausal state: Secondary | ICD-10-CM | POA: Diagnosis not present

## 2018-11-15 HISTORY — DX: Hypercalcemia: E83.52

## 2018-11-15 MED ORDER — TENIVAC 5-2 LFU IM INJ: 0.5000 mL | INJECTION | Freq: Once | INTRAMUSCULAR | 0 refills | Status: AC

## 2018-11-15 NOTE — Patient Instructions (Addendum)
Thank you for coming to see me today. It was a pleasure! Today we talked about:   I placed an order for you to have your bone density screening, and please have your mammogram done on 10/26. I have also placed a referral to a gastroenterologist in order to call you in regards to scheduling a colonoscopy.   Please let me know if you are having any trouble.  Please continue your current medications.  I will send the tetanus shot to your pharmacy, please have this done when you are able.  Please follow-up with me in 6 months or sooner as needed.  If you have any questions or concerns, please do not hesitate to call the office at [phone].  Take Care,   [place] Chalise Pe, DO

## 2018-11-15 NOTE — Progress Notes (Signed)
Date of Visit: 11/15/2018   HPI:  Patient presents today for a well woman exam.   Concerns today: high calcium level- being worked up by endocrinology currently  Periods: post-menopausal Pap smear status: no abnormals in past, last 2018 Exercise: daily Diet: healthy Smoking: n/a Alcohol: n/a Drugs: n/a Advance directives: none in place Mood: good Dentist: has one  ROS: See HPI  [place]:  Cancers in family: none  PHYSICAL EXAM: BP 132/72   Pulse 64   Ht 5\' 4"  (1.626 m)   Wt 180 lb 2 oz (81.7 kg)   SpO2 97%   BMI 30.92 kg/m  Gen: NAD, pleasant, cooperative HEENT: NCAT, PERRL, no palpable thyromegaly or anterior cervical lymphadenopathy Heart: RRR, no murmurs Lungs: CTAB, NWOB Abdomen: soft, nontender to palpation Neuro: grossly nonfocal, speech normal GU: normal appearing external genitalia without lesions. Vagina is moist with white discharge. Cervix normal in appearance. No cervical motion tenderness or tenderness on bimanual exam. No adnexal masses.   ASSESSMENT/PLAN:  # Health maintenance:  -pap smear: up to date -mammogram: scheduled -lipid screening: needs repeat -DEXA: ordered today - colonoscopy: ordered today -immunizations: flu today, pna today, tetanus to pharmacy -advance directives: n/a -handout given on health maintenance topics  -high calcium level- being worked up by endocrinology currently   FOLLOW UP: Follow up in 6 months [place] Aysel Gilchrest, [place] PGY-3, [place]

## 2018-11-16 LAB — HEPATITIS C ANTIBODY: Hep C Virus Ab: <0.1 s/co ratio (ref 0.0–0.9)

## 2018-11-21 ENCOUNTER — Encounter: Payer: Self-pay | Admitting: Family Medicine

## 2018-11-22 ENCOUNTER — Encounter: Payer: Self-pay | Admitting: Gastroenterology

## 2018-11-22 ENCOUNTER — Other Ambulatory Visit: Payer: Self-pay | Admitting: Family Medicine

## 2018-11-24 ENCOUNTER — Ambulatory Visit (HOSPITAL_COMMUNITY)
Admission: RE | Admit: 2018-11-24 | Discharge: 2018-11-24 | Disposition: A | Discharge status: Home / Self Care | Payer: Medicare Other | Source: Ambulatory Visit | Attending: Surgery | Admitting: Surgery

## 2018-11-24 ENCOUNTER — Encounter (HOSPITAL_COMMUNITY)
Admission: RE | Admit: 2018-11-24 | Discharge: 2018-11-24 | Disposition: A | Discharge status: Home / Self Care | Payer: Medicare Other | Source: Ambulatory Visit | Attending: Surgery | Admitting: Surgery

## 2018-11-24 ENCOUNTER — Other Ambulatory Visit: Payer: Self-pay

## 2018-11-24 DIAGNOSIS — E210 Primary hyperparathyroidism: Secondary | ICD-10-CM

## 2018-11-24 MED ORDER — TECHNETIUM TC 99M SESTAMIBI GENERIC - CARDIOLITE
26.1000 | Freq: Once | INTRAVENOUS | Status: AC | PRN
  Administered 2018-11-24: 26.1 via INTRAVENOUS

## 2018-11-27 ENCOUNTER — Ambulatory Visit
Admission: RE | Admit: 2018-11-27 | Discharge: 2018-11-27 | Disposition: A | Discharge status: Home / Self Care | Payer: Medicare Other | Source: Ambulatory Visit | Attending: *Deleted | Admitting: *Deleted

## 2018-11-27 ENCOUNTER — Other Ambulatory Visit: Payer: Self-pay

## 2018-11-27 DIAGNOSIS — Z1231 Encounter for screening mammogram for malignant neoplasm of breast: Secondary | ICD-10-CM

## 2018-11-30 ENCOUNTER — Encounter: Payer: Self-pay | Admitting: Family Medicine

## 2018-12-05 ENCOUNTER — Other Ambulatory Visit: Payer: Self-pay | Admitting: Surgery

## 2018-12-05 DIAGNOSIS — E041 Nontoxic single thyroid nodule: Secondary | ICD-10-CM

## 2018-12-12 ENCOUNTER — Other Ambulatory Visit: Payer: Self-pay

## 2018-12-12 ENCOUNTER — Encounter: Payer: Self-pay | Admitting: Family Medicine

## 2018-12-12 ENCOUNTER — Ambulatory Visit (AMBULATORY_SURGERY_CENTER): Payer: Self-pay

## 2018-12-12 VITALS — Temp 96.6°F | Ht 64.0 in | Wt 179.4 lb

## 2018-12-12 DIAGNOSIS — Z23 Encounter for immunization: Secondary | ICD-10-CM

## 2018-12-12 DIAGNOSIS — Z1211 Encounter for screening for malignant neoplasm of colon: Secondary | ICD-10-CM

## 2018-12-12 MED ORDER — TETANUS-DIPHTHERIA TOXOIDS TD 2-2 LF/0.5ML IM SUSP: 0.5000 mL | Freq: Once | INTRAMUSCULAR | 0 refills | Status: AC

## 2018-12-12 NOTE — Progress Notes (Signed)
Denies allergies to eggs or soy products. Denies complication of anesthesia or sedation. Denies use of weight loss medication. Denies use of O2.   Emmi instructions given for colonoscopy.  Patient is scheduled for Covid on Thursday 12/14/18 @ 10:35 Am.

## 2018-12-14 ENCOUNTER — Ambulatory Visit (INDEPENDENT_AMBULATORY_CARE_PROVIDER_SITE_OTHER): Payer: Medicare Other

## 2018-12-14 DIAGNOSIS — Z1159 Encounter for screening for other viral diseases: Secondary | ICD-10-CM

## 2018-12-15 LAB — SARS CORONAVIRUS 2 (TAT 6-24 HRS): SARS Coronavirus 2: NEGATIVE

## 2018-12-19 ENCOUNTER — Encounter: Payer: Self-pay | Admitting: Gastroenterology

## 2018-12-19 ENCOUNTER — Ambulatory Visit (AMBULATORY_SURGERY_CENTER): Payer: Medicare Other | Admitting: Gastroenterology

## 2018-12-19 ENCOUNTER — Other Ambulatory Visit: Payer: Self-pay

## 2018-12-19 VITALS — BP 125/76 | HR 59 | Temp 97.8°F | Resp 13 | Ht 64.0 in | Wt 179.0 lb

## 2018-12-19 DIAGNOSIS — D123 Benign neoplasm of transverse colon: Secondary | ICD-10-CM | POA: Diagnosis not present

## 2018-12-19 DIAGNOSIS — Z1211 Encounter for screening for malignant neoplasm of colon: Secondary | ICD-10-CM | POA: Diagnosis present

## 2018-12-19 MED ORDER — SODIUM CHLORIDE 0.9 % IV SOLN: 500.0000 mL | Freq: Once | INTRAVENOUS | Status: DC

## 2018-12-19 NOTE — Progress Notes (Signed)
Called to room to assist during endoscopic procedure.  Patient ID and intended procedure confirmed with present staff. Received instructions for my participation in the procedure from the performing physician.  

## 2018-12-19 NOTE — Op Note (Signed)
[place] Patient Name: Maria Wall Procedure Date: 12/19/2018 7:52 AM MRN: [id number] Endoscopist: Justice Britain , MD Age: 68 Referring MD:  Date of Birth: [birth year] Gender: Female Account #: [id number] Procedure:                Colonoscopy Indications:              Screening for colorectal malignant neoplasm Medicines:                Monitored Anesthesia Care Procedure:                Pre-Anesthesia Assessment:                           - Prior to the procedure, a History and Physical                            was performed, and patient medications and                            allergies were reviewed. The patient's tolerance of                            previous anesthesia was also reviewed. The risks                            and benefits of the procedure and the sedation                            options and risks were discussed with the patient.                            All questions were answered, and informed consent                            was obtained. Prior Anticoagulants: The patient has                            taken no previous anticoagulant or antiplatelet                            agents. ASA Grade Assessment: II - A patient with                            mild systemic disease. After reviewing the risks                            and benefits, the patient was deemed in                            satisfactory condition to undergo the procedure.                           After obtaining informed consent, the colonoscope  was passed under direct vision. Throughout the                            procedure, the patient's blood pressure, pulse, and                            oxygen saturations were monitored continuously. The                            Colonoscope was introduced through the anus and                            advanced to the 5 cm into the ileum. The                            colonoscopy was performed  without difficulty. The                            patient tolerated the procedure. The quality of the                            bowel preparation was good. The terminal ileum,                            ileocecal valve, appendiceal orifice, and rectum                            were photographed. Scope In: 7:55:04 AM Scope Out: 8:11:00 AM Scope Withdrawal Time: 0 hours 11 minutes 38 seconds  Total Procedure Duration: 0 hours 15 minutes 56 seconds  Findings:                 The digital rectal exam findings include                            hemorrhoids. Pertinent negatives include no                            palpable rectal lesions.                           The terminal ileum and ileocecal valve appeared                            normal.                           A 5 mm polyp was found in the transverse colon. The                            polyp was sessile. The polyp was removed with a                            cold snare. Resection and retrieval were complete.  Normal mucosa was found in the entire colon                            otherwise.                           Non-bleeding non-thrombosed external and internal                            hemorrhoids were found during retroflexion, during                            perianal exam and during digital exam. The                            hemorrhoids were Grade II (internal hemorrhoids                            that prolapse but reduce spontaneously). Complications:            No immediate complications. Estimated Blood Loss:     Estimated blood loss was minimal. Impression:               - Hemorrhoids found on digital rectal exam.                           - The examined portion of the ileum was normal.                           - One 5 mm polyp in the transverse colon, removed                            with a cold snare. Resected and retrieved.                           - Normal mucosa in the entire  examined colon                            otherwise.                           - Non-bleeding non-thrombosed external and internal                            hemorrhoids. Recommendation:           - The patient will be observed post-procedure,                            until all discharge criteria are met.                           - Discharge patient to home.                           - Patient has a contact number available for  emergencies. The signs and symptoms of potential                            delayed complications were discussed with the                            patient. Return to normal activities tomorrow.                            Written discharge instructions were provided to the                            patient.                           - High fiber diet.                           - Use FiberCon 1 tablet PO daily.                           - Continue present medications.                           - Await pathology results.                           - Repeat colonoscopy 7/10 years for surveillance                            based on pathology results.                           - The findings and recommendations were discussed                            with the patient. Justice Britain, MD 12/19/2018 8:16:47 AM

## 2018-12-19 NOTE — Progress Notes (Signed)
Report given to PACU, vss 

## 2018-12-19 NOTE — Patient Instructions (Signed)
Thank you for allowing [place] to care for you today!  Await pathology results, approximately 1-2 weeks.  Recommendation for next colonoscopy will be made at that time.  Recommend adopting a high fiber diet and taking FiberCon 1 tablet by mouth daily.  Resume other medications as before.  Resume your normal activities tomorrow.     YOU HAD AN ENDOSCOPIC PROCEDURE TODAY AT [facility]:   Refer to the procedure report that was given to you for any specific questions about what was found during the examination.  If the procedure report does not answer your questions, please call your gastroenterologist to clarify.  If you requested that your care partner not be given the details of your procedure findings, then the procedure report has been included in a sealed envelope for you to review at your convenience later.  YOU SHOULD EXPECT: Some feelings of bloating in the abdomen. Passage of more gas than usual.  Walking can help get rid of the air that was put into your GI tract during the procedure and reduce the bloating. If you had a lower endoscopy (such as a colonoscopy or flexible sigmoidoscopy) you may notice spotting of blood in your stool or on the toilet paper. If you underwent a bowel prep for your procedure, you may not have a normal bowel movement for a few days.  Please Note:  You might notice some irritation and congestion in your nose or some drainage.  This is from the oxygen used during your procedure.  There is no need for concern and it should clear up in a day or so.  SYMPTOMS TO REPORT IMMEDIATELY:   Following lower endoscopy (colonoscopy or flexible sigmoidoscopy):  Excessive amounts of blood in the stool  Significant tenderness or worsening of abdominal pains  Swelling of the abdomen that is new, acute  Fever of 100F or higher   For urgent or emergent issues, a gastroenterologist can be reached at any hour by calling [phone].   DIET:  We do recommend  a small meal at first, but then you may proceed to your regular diet.  Drink plenty of fluids but you should avoid alcoholic beverages for 24 hours.  ACTIVITY:  You should plan to take it easy for the rest of today and you should NOT DRIVE or use heavy machinery until tomorrow (because of the sedation medicines used during the test).    FOLLOW UP: Our staff will call the number listed on your records 48-72 hours following your procedure to check on you and address any questions or concerns that you may have regarding the information given to you following your procedure. If we do not reach you, we will leave a message.  We will attempt to reach you two times.  During this call, we will ask if you have developed any symptoms of COVID 19. If you develop any symptoms (ie: fever, flu-like symptoms, shortness of breath, cough etc.) before then, please call [phone].  If you test positive for Covid 19 in the 2 weeks post procedure, please call and report this information to [place].    If any biopsies were taken you will be contacted by phone or by letter within the next 1-3 weeks.  Please call us at [phone] if you have not heard about the biopsies in 3 weeks.    SIGNATURES/CONFIDENTIALITY: You and/or your care partner have signed paperwork which will be entered into your electronic medical record.  These signatures attest to the fact  that that the information above on your After Visit Summary has been reviewed and is understood.  Full responsibility of the confidentiality of this discharge information lies with you and/or your care-partner.

## 2018-12-19 NOTE — Progress Notes (Signed)
Temp check by:JB Vital check by:CW  The patient states no changes in medical or surgical history since pre-visit screening on 12/12/2018.

## 2018-12-20 ENCOUNTER — Other Ambulatory Visit (HOSPITAL_COMMUNITY)
Admission: RE | Admit: 2018-12-20 | Discharge: 2018-12-20 | Disposition: A | Discharge status: Home / Self Care | Payer: Medicare Other | Source: Ambulatory Visit | Attending: Surgery | Admitting: Surgery

## 2018-12-20 ENCOUNTER — Ambulatory Visit
Admission: RE | Admit: 2018-12-20 | Discharge: 2018-12-20 | Disposition: A | Discharge status: Home / Self Care | Payer: Medicare Other | Source: Ambulatory Visit | Attending: Surgery | Admitting: Surgery

## 2018-12-20 DIAGNOSIS — E041 Nontoxic single thyroid nodule: Secondary | ICD-10-CM | POA: Diagnosis present

## 2018-12-21 ENCOUNTER — Telehealth: Payer: Self-pay

## 2018-12-21 ENCOUNTER — Encounter: Payer: Self-pay | Admitting: Gastroenterology

## 2018-12-21 ENCOUNTER — Other Ambulatory Visit: Payer: Medicare Other

## 2018-12-21 LAB — CYTOLOGY - NON PAP

## 2018-12-21 NOTE — Telephone Encounter (Signed)
  Follow up Call-  Call back number 12/19/2018  Post procedure Call Back phone  # [phone]  Permission to leave phone message Yes     Patient questions:  Do you have a fever, pain , or abdominal swelling? No. Pain Score  0 *  Have you tolerated food without any problems? Yes.    Have you been able to return to your normal activities? Yes.    Do you have any questions about your discharge instructions: Diet   No. Medications  No. Follow up visit  No.  Do you have questions or concerns about your Care? No.  Actions: * If pain score is 4 or above: No action needed, pain <4.  1. Have you developed a fever since your procedure? No  2.   Have you had an respiratory symptoms (SOB or cough) since your procedure? No  3.   Have you tested positive for COVID 19 since your procedure No  4.   Have you had any family members/close contacts diagnosed with the COVID 19 since your procedure?  No   If yes to any of these questions please route to Joylene John, RN and Alphonsa Gin, RN.

## 2019-01-01 MED ORDER — TETANUS-DIPHTH-ACELL PERTUSSIS 5-2-15.5 LF-MCG/0.5 IM SUSP: 0.5000 mL | Freq: Once | INTRAMUSCULAR | 0 refills | Status: AC

## 2019-01-01 NOTE — Telephone Encounter (Signed)
Fax received from pharmacy stating that they didn't have access to the first tetanus vaccine script and have the adacel in stock.  New script sent for this.  Sherece Gambrill,CMA

## 2019-01-01 NOTE — Addendum Note (Signed)
Addended by: Valerie Roys on: 01/01/2019 08:18 AM   Modules accepted: Orders

## 2019-01-09 ENCOUNTER — Other Ambulatory Visit: Payer: Self-pay | Admitting: Surgery

## 2019-01-09 DIAGNOSIS — E210 Primary hyperparathyroidism: Secondary | ICD-10-CM

## 2019-01-18 ENCOUNTER — Ambulatory Visit
Admission: RE | Admit: 2019-01-18 | Discharge: 2019-01-18 | Disposition: A | Discharge status: Home / Self Care | Payer: Medicare Other | Source: Ambulatory Visit | Attending: Surgery | Admitting: Surgery

## 2019-01-18 DIAGNOSIS — E210 Primary hyperparathyroidism: Secondary | ICD-10-CM

## 2019-01-18 MED ORDER — IOPAMIDOL (ISOVUE-300) INJECTION 61%
75.0000 mL | Freq: Once | INTRAVENOUS | Status: AC | PRN
  Administered 2019-01-18: 75 mL via INTRAVENOUS

## 2019-01-22 ENCOUNTER — Ambulatory Visit: Payer: Self-pay | Admitting: Surgery

## 2019-02-13 ENCOUNTER — Ambulatory Visit
Admission: RE | Admit: 2019-02-13 | Discharge: 2019-02-13 | Disposition: A | Discharge status: Home / Self Care | Payer: Medicare Other | Source: Ambulatory Visit | Attending: Family Medicine | Admitting: Family Medicine

## 2019-02-13 ENCOUNTER — Other Ambulatory Visit: Payer: Self-pay

## 2019-02-13 ENCOUNTER — Encounter: Payer: Self-pay | Admitting: Family Medicine

## 2019-02-13 DIAGNOSIS — Z780 Asymptomatic menopausal state: Secondary | ICD-10-CM

## 2019-02-15 ENCOUNTER — Encounter: Payer: Self-pay | Admitting: Family Medicine

## 2019-02-17 MED ORDER — AMLODIPINE BESYLATE 5 MG PO TABS: 5.0000 mg | ORAL_TABLET | Freq: Every day | ORAL | 0 refills | Status: DC

## 2019-02-18 ENCOUNTER — Encounter (HOSPITAL_COMMUNITY): Payer: Self-pay | Admitting: Surgery

## 2019-02-18 DIAGNOSIS — E210 Primary hyperparathyroidism: Secondary | ICD-10-CM | POA: Diagnosis present

## 2019-02-18 DIAGNOSIS — M810 Age-related osteoporosis without current pathological fracture: Secondary | ICD-10-CM

## 2019-02-18 HISTORY — DX: Age-related osteoporosis without current pathological fracture: M81.0

## 2019-02-18 NOTE — H&P (Signed)
General Surgery [facility]  [place] Documented: 11/08/2018 10:38 AM Location: [facility] Patient #: [id number] DOB: Apr 02, 1950 Married / Language: Cleophus Molt / Race: White Female  Primary care physician: Dr. Martinique Shirley   History of Present Illness   The patient is a 69 year old female who presents with primary hyperparathyroidism.  CHIEF COMPLAINT: primary hyperparathyroidism, nephrolithiasis  The patient is referred by Dr. Alexis Frock for surgical evaluation and management of suspected primary hyperparathyroidism. Clinical history had started with an episode of appendicitis managed by one of my partners several months ago. CT scan for appendicitis demonstrated a large kidney stone. As part of her evaluation for nephrolithiasis, laboratory studies were obtained in June 2020. This showed serum calcium levels ranging from 11.4-12.2. Intact PTH level was elevated at 161. Vitamin D levels were normal. Patient has had a history of nephrolithiasis for many years. She also notes chronic fatigue. She notes frequent urination. She has not had a bone density scan in many years. She denies any recent fractures. Patient is now referred for surgical evaluation. She has not had any imaging studies performed. Patient has had no prior head or neck surgery. There is no family history of any endocrine neoplasms.   Problem List/Past Medical  HISTORY OF NEPHROLITHIASIS [phone])  PRIMARY HYPERPARATHYROIDISM (E21.0)  S/P LAPAROSCOPIC APPENDECTOMY (Z90.49) (Z90.49) [07/25/2018]: ACUTE APPENDICITIS (K35.80) (K35.80) [07/25/2018]:  Past Surgical History  Appendectomy   Diagnostic Studies History Colonoscopy  5-10 years ago Mammogram  >3 years ago Pap Smear  1-5 years ago  Allergies No Known Drug Allergies Allergies Reconciled   Medication History  amLODIPine Besylate (5MG  Tablet, Oral) Active. Turmeric (Oral) Specific strength  unknown - Active. Hair Skin & Nails Advanced (Oral) Specific strength unknown - Active. Multi-Day (Oral) Active. Omega 3 (Oral) Specific strength unknown - Active. Magnesium (Oral) Specific strength unknown - Active. Medications Reconciled  Social History  Alcohol use  Occasional alcohol use. Caffeine use  Coffee. No alcohol use  No drug use  Tobacco use  Never smoker.  Family History Cerebrovascular Accident  Mother. Depression  Father, Mother. Heart Disease  Father. Hypertension  Father, Mother.  Pregnancy / Birth History Age at menarche  50 years. Age of menopause  53-50 Contraceptive History  Oral contraceptives. Gravida  3 Irregular periods  Length (months) of breastfeeding  12-24 Maternal age  73-30 Para  3  Other Problems Gastroesophageal Reflux Disease  High blood pressure  Hypercholesterolemia  Kidney Stone   Review of Systems General Not Present- Appetite Loss, Chills, Fatigue, Fever, Night Sweats, Weight Gain and Weight Loss. Skin Present- Rash. Not Present- Change in Wart/Mole, Dryness, Hives, Jaundice, New Lesions, Non-Healing Wounds and Ulcer. HEENT Not Present- Earache, Hearing Loss, Hoarseness, Nose Bleed, Oral Ulcers, Ringing in the Ears, Seasonal Allergies, Sinus Pain, Sore Throat, Visual Disturbances, Wears glasses/contact lenses and Yellow Eyes. Respiratory Not Present- Bloody sputum, Chronic Cough, Difficulty Breathing, Snoring and Wheezing. Breast Not Present- Breast Mass, Breast Pain, Nipple Discharge and Skin Changes. Cardiovascular Not Present- Chest Pain, Difficulty Breathing Lying Down, Leg Cramps, Palpitations, Rapid Heart Rate, Shortness of Breath and Swelling of Extremities. Gastrointestinal Not Present- Abdominal Pain, Bloating, Bloody Stool, Change in Bowel Habits, Chronic diarrhea, Constipation, Difficulty Swallowing, Excessive gas, Gets full quickly at meals, Hemorrhoids, Indigestion, Nausea, Rectal Pain and  Vomiting. Female Genitourinary Not Present- Frequency, Nocturia, Painful Urination, Pelvic Pain and Urgency. Musculoskeletal Not Present- Back Pain, Joint Pain, Joint Stiffness, Muscle Pain, Muscle Weakness and Swelling of Extremities. Neurological Not  Present- Decreased Memory, Fainting, Headaches, Numbness, Seizures, Tingling, Tremor, Trouble walking and Weakness. Psychiatric Not Present- Anxiety, Bipolar, Change in Sleep Pattern, Depression, Fearful and Frequent crying. Endocrine Not Present- Cold Intolerance, Excessive Hunger, Hair Changes, Heat Intolerance, Hot flashes and New Diabetes. Hematology Not Present- Blood Thinners, Easy Bruising, Excessive bleeding, Gland problems, HIV and Persistent Infections.  Vitals  Weight: 179.6 lb Height: 64in Body Surface Area: 1.87 m Body Mass Index: 30.83 kg/m  Temp.: 97.63F  Pulse: 77 (Regular)  BP: 134/88 (Sitting, Left Arm, Standard)  Physical Exam   See vital signs recorded above  GENERAL APPEARANCE Development: normal Nutritional status: normal Gross deformities: none  SKIN Rash, lesions, ulcers: none Induration, erythema: none Nodules: none palpable  EYES Conjunctiva and lids: normal Pupils: equal and reactive Iris: normal bilaterally  EARS, NOSE, MOUTH, THROAT External ears: no lesion or deformity External nose: no lesion or deformity Hearing: grossly normal Patient is wearing a mask.  NECK Symmetric: yes Trachea: midline Thyroid: no palpable nodules in the thyroid bed  CHEST Respiratory effort: normal Retraction or accessory muscle use: no Breath sounds: normal bilaterally Rales, rhonchi, wheeze: none  CARDIOVASCULAR Auscultation: regular rhythm, normal rate Murmurs: none Pulses: carotid and radial pulse 2+ palpable Lower extremity edema: none Lower extremity varicosities: none  MUSCULOSKELETAL Station and gait: normal Digits and nails: no clubbing or cyanosis Muscle strength: grossly normal  all extremities Range of motion: grossly normal all extremities Deformity: none  LYMPHATIC Cervical: none palpable Supraclavicular: none palpable  PSYCHIATRIC Oriented to person, place, and time: yes Mood and affect: normal for situation Judgment and insight: appropriate for situation    Assessment & Plan  PRIMARY HYPERPARATHYROIDISM (E21.0) HISTORY OF NEPHROLITHIASIS [id number])  Follow Up - Call [facility] office after tests / studies doneto discuss further plans  Patient is referred by her urologist for evaluation of suspected primary hyperparathyroidism. Patient is provided with written literature on parathyroid surgery to review at home.  Patient has a long-standing history of recurrent nephrolithiasis. Laboratory studies show significantly elevated calcium levels and intact PTH levels. Other symptoms include fatigue and frequent urination. I believe the patient does have primary hyperparathyroidism. We will plan to proceed with imaging studies including an ultrasound examination of the neck and a nuclear medicine parathyroid scan. We will also obtain a 24-hour urine collection for calcium to complete her evaluation.  The patient and I discussed minimally invasive parathyroidectomy for treatment of primary hyperparathyroidism. We discussed risk and benefits of the procedure including the risk of recurrent laryngeal nerve injury and the potential for additional surgery if she has multiple gland disease. The patient understands and agrees to proceed with further testing. We will contact her with the results when they are available.  Pt Education - Pamphlet Given - The Parathyroid Surgery Book: discussed with patient and provided information.  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  24 hour urine positive with elevated calcium > 500.  Telephone call to patient - [facility] - with these results.  Dr. Martinique Shirley also send  bone density scan results demonstrating osteoporosis.  Proceed with scheduling for parathyroid surgery as outpatient, minimally invasive approach.  The risks and benefits of the procedure have been discussed at length with the patient. The patient understands the proposed procedure, potential alternative treatments, and the course of recovery to be expected. All of the patient's questions have been answered at this time. The patient wishes to proceed with surgery.  Armandina Gemma, MD [facility] Office: [phone]

## 2019-02-20 ENCOUNTER — Other Ambulatory Visit (HOSPITAL_COMMUNITY)
Admission: RE | Admit: 2019-02-20 | Discharge: 2019-02-20 | Disposition: A | Discharge status: Home / Self Care | Payer: Medicare Other | Source: Ambulatory Visit | Attending: Surgery | Admitting: Surgery

## 2019-02-20 DIAGNOSIS — Z20822 Contact with and (suspected) exposure to covid-19: Secondary | ICD-10-CM | POA: Diagnosis not present

## 2019-02-20 DIAGNOSIS — Z01812 Encounter for preprocedural laboratory examination: Secondary | ICD-10-CM | POA: Diagnosis present

## 2019-02-20 NOTE — Patient Instructions (Addendum)
DUE TO COVID-19 ONLY ONE VISITOR IS ALLOWED TO COME WITH YOU AND STAY IN THE WAITING ROOM ONLY DURING PRE OP AND PROCEDURE DAY OF SURGERY. THE 1 VISITOR MAY VISIT WITH YOU AFTER SURGERY IN YOUR PRIVATE ROOM DURING VISITING HOURS ONLY!  YOU NEED TO HAVE A COVID 19 TEST ON 02-20-19. PLEASE BEGIN THE QUARANTINE INSTRUCTIONS AS OUTLINED IN YOUR HANDOUT.                Maria Wall  02/20/2019   Your procedure is scheduled on: 02-23-19   Report to [facility] Main  Entrance    Report to Admitting at 9:00 AM     Call this number if you have problems the morning of surgery [phone]    Remember: Do not eat food or drink liquids :After Midnight.      Take these medicines the morning of surgery with A SIP OF WATER: Amlodipine (Norvasc)  BRUSH YOUR TEETH MORNING OF SURGERY AND RINSE YOUR MOUTH OUT, NO CHEWING GUM CANDY OR MINTS.                               You may not have any metal on your body including hair pins and              piercings     Do not wear jewelry, make-up, lotions, powders or perfumes, deodorant              Do not wear nail polish on your fingernails.  Do not shave  48 hours prior to surgery.     Do not bring valuables to the hospital. [place].  Contacts, dentures or bridgework may not be worn into surgery.      Patients discharged the day of surgery will not be allowed to drive home. IF YOU ARE HAVING SURGERY AND GOING HOME THE SAME DAY, YOU MUST HAVE AN ADULT TO DRIVE YOU HOME AND BE WITH YOU FOR 24 HOURS. YOU MAY GO HOME BY TAXI OR UBER OR ORTHERWISE, BUT AN ADULT MUST ACCOMPANY YOU HOME AND STAY WITH YOU FOR 24 HOURS.  Name and phone number of your driver: Tal Friloux [phone]  Special Instructions: N/A              Please read over the following fact sheets you were given: _____________________________________________________________________             [facility] - Preparing for  Surgery Before surgery, you can play an important role.  Because skin is not sterile, your skin needs to be as free of germs as possible.  You can reduce the number of germs on your skin by washing with CHG (chlorahexidine gluconate) soap before surgery.  CHG is an antiseptic cleaner which kills germs and bonds with the skin to continue killing germs even after washing. Please DO NOT use if you have an allergy to CHG or antibacterial soaps.  If your skin becomes reddened/irritated stop using the CHG and inform your nurse when you arrive at Short Stay. Do not shave (including legs and underarms) for at least 48 hours prior to the first CHG shower.  You may shave your face/neck. Please follow these instructions carefully:  1.  Shower with CHG Soap the night before surgery and the  morning of Surgery.  2.  If you  choose to wash your hair, wash your hair first as usual with your  normal  shampoo.  3.  After you shampoo, rinse your hair and body thoroughly to remove the  shampoo.                           4.  Use CHG as you would any other liquid soap.  You can apply chg directly  to the skin and wash                       Gently with a scrungie or clean washcloth.  5.  Apply the CHG Soap to your body ONLY FROM THE NECK DOWN.   Do not use on face/ open                           Wound or open sores. Avoid contact with eyes, ears mouth and genitals (private parts).                       Wash face,  Genitals (private parts) with your normal soap.             6.  Wash thoroughly, paying special attention to the area where your surgery  will be performed.  7.  Thoroughly rinse your body with warm water from the neck down.  8.  DO NOT shower/wash with your normal soap after using and rinsing off  the CHG Soap.                9.  Pat yourself dry with a clean towel.            10.  Wear clean pajamas.            11.  Place clean sheets on your bed the night of your first shower and do not  sleep with pets. Day  of Surgery : Do not apply any lotions/deodorants the morning of surgery.  Please wear clean clothes to the hospital/surgery center.  FAILURE TO FOLLOW THESE INSTRUCTIONS MAY RESULT IN THE CANCELLATION OF YOUR SURGERY PATIENT SIGNATURE_________________________________  NURSE SIGNATURE__________________________________  ________________________________________________________________________

## 2019-02-21 ENCOUNTER — Encounter (HOSPITAL_COMMUNITY): Payer: Self-pay

## 2019-02-21 ENCOUNTER — Encounter (HOSPITAL_COMMUNITY)
Admission: RE | Admit: 2019-02-21 | Discharge: 2019-02-21 | Disposition: A | Discharge status: Home / Self Care | Payer: Medicare Other | Source: Ambulatory Visit | Attending: Surgery | Admitting: Surgery

## 2019-02-21 ENCOUNTER — Other Ambulatory Visit: Payer: Self-pay

## 2019-02-21 DIAGNOSIS — Z01812 Encounter for preprocedural laboratory examination: Secondary | ICD-10-CM | POA: Insufficient documentation

## 2019-02-21 DIAGNOSIS — E210 Primary hyperparathyroidism: Secondary | ICD-10-CM | POA: Diagnosis not present

## 2019-02-21 LAB — CBC
HCT: 43.3 % (ref 36.0–46.0)
Hemoglobin: 14.3 g/dL (ref 12.0–15.0)
MCH: 30.2 pg (ref 26.0–34.0)
MCHC: 33 g/dL (ref 30.0–36.0)
MCV: 91.4 fL (ref 80.0–100.0)
Platelets: 212 10*3/uL (ref 150–400)
RBC: 4.74 MIL/uL (ref 3.87–5.11)
RDW: 13.2 % (ref 11.5–15.5)
WBC: 6 10*3/uL (ref 4.0–10.5)
nRBC: 0 % (ref 0.0–0.2)

## 2019-02-21 LAB — BASIC METABOLIC PANEL
Anion gap: 5 (ref 5–15)
BUN: 13 mg/dL (ref 8–23)
CO2: 31 mmol/L (ref 22–32)
Calcium: 12.4 mg/dL — ABNORMAL HIGH (ref 8.9–10.3)
Chloride: 104 mmol/L (ref 98–111)
Creatinine, Ser: 0.58 mg/dL (ref 0.44–1.00)
GFR calc Af Amer: >60 mL/min (ref >60)
GFR calc non Af Amer: >60 mL/min (ref >60)
Glucose, Bld: 119 mg/dL — ABNORMAL HIGH (ref 70–99)
Potassium: 4.4 mmol/L (ref 3.5–5.1)
Sodium: 140 mmol/L (ref 135–145)

## 2019-02-21 LAB — NOVEL CORONAVIRUS, NAA (HOSP ORDER, SEND-OUT TO REF LAB; TAT 18-24 HRS): SARS-CoV-2, NAA: NOT DETECTED

## 2019-02-23 ENCOUNTER — Ambulatory Visit (HOSPITAL_COMMUNITY): Payer: Medicare Other | Admitting: Physician Assistant

## 2019-02-23 ENCOUNTER — Encounter (HOSPITAL_COMMUNITY)
Admission: RE | Disposition: A | Discharge status: Home / Self Care | Payer: Self-pay | Source: Home / Self Care | Attending: Surgery

## 2019-02-23 ENCOUNTER — Encounter (HOSPITAL_COMMUNITY): Payer: Self-pay | Admitting: Surgery

## 2019-02-23 ENCOUNTER — Ambulatory Visit (HOSPITAL_COMMUNITY)
Admission: RE | Admit: 2019-02-23 | Discharge: 2019-02-23 | Disposition: A | Discharge status: Home / Self Care | Payer: Medicare Other | Attending: Surgery | Admitting: Surgery

## 2019-02-23 ENCOUNTER — Other Ambulatory Visit: Payer: Self-pay

## 2019-02-23 DIAGNOSIS — E079 Disorder of thyroid, unspecified: Secondary | ICD-10-CM | POA: Diagnosis not present

## 2019-02-23 DIAGNOSIS — Z79899 Other long term (current) drug therapy: Secondary | ICD-10-CM | POA: Insufficient documentation

## 2019-02-23 DIAGNOSIS — K219 Gastro-esophageal reflux disease without esophagitis: Secondary | ICD-10-CM | POA: Diagnosis not present

## 2019-02-23 DIAGNOSIS — E669 Obesity, unspecified: Secondary | ICD-10-CM | POA: Diagnosis not present

## 2019-02-23 DIAGNOSIS — E7800 Pure hypercholesterolemia, unspecified: Secondary | ICD-10-CM | POA: Diagnosis not present

## 2019-02-23 DIAGNOSIS — M810 Age-related osteoporosis without current pathological fracture: Secondary | ICD-10-CM | POA: Diagnosis not present

## 2019-02-23 DIAGNOSIS — E210 Primary hyperparathyroidism: Secondary | ICD-10-CM | POA: Insufficient documentation

## 2019-02-23 DIAGNOSIS — Z6830 Body mass index (BMI) 30.0-30.9, adult: Secondary | ICD-10-CM | POA: Insufficient documentation

## 2019-02-23 DIAGNOSIS — M8000XG Age-related osteoporosis with current pathological fracture, unspecified site, subsequent encounter for fracture with delayed healing: Secondary | ICD-10-CM

## 2019-02-23 DIAGNOSIS — Z87442 Personal history of urinary calculi: Secondary | ICD-10-CM | POA: Insufficient documentation

## 2019-02-23 DIAGNOSIS — I10 Essential (primary) hypertension: Secondary | ICD-10-CM | POA: Diagnosis not present

## 2019-02-23 HISTORY — PX: PARATHYROIDECTOMY: SHX19

## 2019-02-23 SURGERY — PARATHYROIDECTOMY
Anesthesia: General | Site: Neck | Laterality: Right

## 2019-02-23 MED ORDER — ROCURONIUM BROMIDE 10 MG/ML (PF) SYRINGE
PREFILLED_SYRINGE | INTRAVENOUS | Status: DC | PRN
  Administered 2019-02-23: 50 mg via INTRAVENOUS

## 2019-02-23 MED ORDER — CHLORHEXIDINE GLUCONATE CLOTH 2 % EX PADS: 6.0000 | MEDICATED_PAD | Freq: Once | CUTANEOUS | Status: DC

## 2019-02-23 MED ORDER — PROPOFOL 10 MG/ML IV BOLUS
INTRAVENOUS | Status: DC | PRN
  Administered 2019-02-23: 150 mg via INTRAVENOUS

## 2019-02-23 MED ORDER — CEFAZOLIN SODIUM-DEXTROSE 2-4 GM/100ML-% IV SOLN
2.0000 g | INTRAVENOUS | Status: AC
  Administered 2019-02-23: 2 g via INTRAVENOUS
  Filled 2019-02-23: qty 100

## 2019-02-23 MED ORDER — FENTANYL CITRATE (PF) 250 MCG/5ML IJ SOLN
INTRAMUSCULAR | Status: DC | PRN
  Administered 2019-02-23: 75 ug via INTRAVENOUS
  Administered 2019-02-23: 25 ug via INTRAVENOUS

## 2019-02-23 MED ORDER — BUPIVACAINE HCL (PF) 0.25 % IJ SOLN
INTRAMUSCULAR | Status: AC
  Filled 2019-02-23: qty 30

## 2019-02-23 MED ORDER — ONDANSETRON HCL 4 MG/2ML IJ SOLN
INTRAMUSCULAR | Status: DC | PRN
  Administered 2019-02-23: 4 mg via INTRAVENOUS

## 2019-02-23 MED ORDER — FENTANYL CITRATE (PF) 100 MCG/2ML IJ SOLN
INTRAMUSCULAR | Status: AC
  Filled 2019-02-23: qty 2

## 2019-02-23 MED ORDER — TRAMADOL HCL 50 MG PO TABS: 50.0000 mg | ORAL_TABLET | Freq: Four times a day (QID) | ORAL | 0 refills | Status: DC | PRN

## 2019-02-23 MED ORDER — LACTATED RINGERS IV SOLN: INTRAVENOUS | Status: DC

## 2019-02-23 MED ORDER — FENTANYL CITRATE (PF) 100 MCG/2ML IJ SOLN
INTRAMUSCULAR | Status: AC
  Administered 2019-02-23: 12:00:00 50 ug via INTRAVENOUS
  Filled 2019-02-23: qty 2

## 2019-02-23 MED ORDER — SUGAMMADEX SODIUM 200 MG/2ML IV SOLN
INTRAVENOUS | Status: DC | PRN
  Administered 2019-02-23: 170 mg via INTRAVENOUS

## 2019-02-23 MED ORDER — MIDAZOLAM HCL 2 MG/2ML IJ SOLN
INTRAMUSCULAR | Status: AC
  Filled 2019-02-23: qty 2

## 2019-02-23 MED ORDER — DEXAMETHASONE SODIUM PHOSPHATE 10 MG/ML IJ SOLN
INTRAMUSCULAR | Status: AC
  Filled 2019-02-23: qty 1

## 2019-02-23 MED ORDER — ONDANSETRON HCL 4 MG/2ML IJ SOLN
INTRAMUSCULAR | Status: AC
  Filled 2019-02-23: qty 2

## 2019-02-23 MED ORDER — MIDAZOLAM HCL 2 MG/2ML IJ SOLN
INTRAMUSCULAR | Status: DC | PRN
  Administered 2019-02-23: 2 mg via INTRAVENOUS

## 2019-02-23 MED ORDER — EPHEDRINE 5 MG/ML INJ
INTRAVENOUS | Status: AC
  Filled 2019-02-23: qty 10

## 2019-02-23 MED ORDER — LIDOCAINE 2% (20 MG/ML) 5 ML SYRINGE
INTRAMUSCULAR | Status: AC
  Filled 2019-02-23: qty 5

## 2019-02-23 MED ORDER — ACETAMINOPHEN 500 MG PO TABS
1000.0000 mg | ORAL_TABLET | Freq: Once | ORAL | Status: AC
  Administered 2019-02-23: 1000 mg via ORAL
  Filled 2019-02-23: qty 2

## 2019-02-23 MED ORDER — PROMETHAZINE HCL 25 MG/ML IJ SOLN: 6.2500 mg | INTRAMUSCULAR | Status: DC | PRN

## 2019-02-23 MED ORDER — PROPOFOL 10 MG/ML IV BOLUS
INTRAVENOUS | Status: AC
  Filled 2019-02-23: qty 20

## 2019-02-23 MED ORDER — FENTANYL CITRATE (PF) 100 MCG/2ML IJ SOLN
25.0000 ug | INTRAMUSCULAR | Status: DC | PRN
  Administered 2019-02-23: 50 ug via INTRAVENOUS

## 2019-02-23 MED ORDER — LIDOCAINE 2% (20 MG/ML) 5 ML SYRINGE
INTRAMUSCULAR | Status: DC | PRN
  Administered 2019-02-23: 80 mg via INTRAVENOUS

## 2019-02-23 MED ORDER — DEXAMETHASONE SODIUM PHOSPHATE 10 MG/ML IJ SOLN
INTRAMUSCULAR | Status: DC | PRN
  Administered 2019-02-23: 4 mg via INTRAVENOUS

## 2019-02-23 MED ORDER — EPHEDRINE SULFATE-NACL 50-0.9 MG/10ML-% IV SOSY
PREFILLED_SYRINGE | INTRAVENOUS | Status: DC | PRN
  Administered 2019-02-23 (×3): 10 mg via INTRAVENOUS
  Administered 2019-02-23: 15 mg via INTRAVENOUS

## 2019-02-23 MED ORDER — 0.9 % SODIUM CHLORIDE (POUR BTL) OPTIME
TOPICAL | Status: DC | PRN
  Administered 2019-02-23: 11:00:00 1000 mL

## 2019-02-23 MED ORDER — BUPIVACAINE HCL (PF) 0.25 % IJ SOLN
INTRAMUSCULAR | Status: DC | PRN
  Administered 2019-02-23: 10 mL

## 2019-02-23 MED ORDER — ROCURONIUM BROMIDE 10 MG/ML (PF) SYRINGE
PREFILLED_SYRINGE | INTRAVENOUS | Status: AC
  Filled 2019-02-23: qty 10

## 2019-02-23 SURGICAL SUPPLY — 30 items
ATTRACTOMAT 16X20 MAGNETIC DRP (DRAPES) ×3 IMPLANT
BLADE SURG 15 STRL LF DISP TIS (BLADE) ×1 IMPLANT
BLADE SURG 15 STRL SS (BLADE) ×2
CHLORAPREP W/TINT 26 (MISCELLANEOUS) ×3 IMPLANT
CLIP VESOCCLUDE MED 6/CT (CLIP) ×6 IMPLANT
CLIP VESOCCLUDE SM WIDE 6/CT (CLIP) ×6 IMPLANT
COVER SURGICAL LIGHT HANDLE (MISCELLANEOUS) ×3 IMPLANT
COVER WAND RF STERILE (DRAPES) ×3 IMPLANT
DERMABOND ADVANCED (GAUZE/BANDAGES/DRESSINGS) ×2
DERMABOND ADVANCED .7 DNX12 (GAUZE/BANDAGES/DRESSINGS) ×1 IMPLANT
DRAPE LAPAROTOMY T 98X78 PEDS (DRAPES) ×3 IMPLANT
ELECT REM PT RETURN 15FT ADLT (MISCELLANEOUS) ×3 IMPLANT
GAUZE 4X4 16PLY RFD (DISPOSABLE) ×3 IMPLANT
GLOVE SURG ORTHO 8.0 STRL STRW (GLOVE) ×3 IMPLANT
GOWN STRL REUS W/TWL XL LVL3 (GOWN DISPOSABLE) ×9 IMPLANT
HEMOSTAT SURGICEL 2X4 FIBR (HEMOSTASIS) ×3 IMPLANT
ILLUMINATOR WAVEGUIDE N/F (MISCELLANEOUS) IMPLANT
KIT BASIN OR (CUSTOM PROCEDURE TRAY) ×3 IMPLANT
KIT TURNOVER KIT A (KITS) IMPLANT
NEEDLE HYPO 25X1 1.5 SAFETY (NEEDLE) ×3 IMPLANT
PACK BASIC VI WITH GOWN DISP (CUSTOM PROCEDURE TRAY) ×3 IMPLANT
PENCIL SMOKE EVACUATOR (MISCELLANEOUS) ×3 IMPLANT
SUT MNCRL AB 4-0 PS2 18 (SUTURE) ×3 IMPLANT
SUT VIC AB 3-0 SH 18 (SUTURE) ×3 IMPLANT
SYR BULB IRRIGATION 50ML (SYRINGE) ×3 IMPLANT
SYR CONTROL 10ML LL (SYRINGE) ×3 IMPLANT
TOWEL OR 17X26 10 PK STRL BLUE (TOWEL DISPOSABLE) ×3 IMPLANT
TOWEL OR NON WOVEN STRL DISP B (DISPOSABLE) ×3 IMPLANT
TUBING CONNECTING 10 (TUBING) ×2 IMPLANT
TUBING CONNECTING 10' (TUBING) ×1

## 2019-02-23 NOTE — Op Note (Signed)
OPERATIVE REPORT - PARATHYROIDECTOMY  Preoperative diagnosis: Primary hyperparathyroidism  Postop diagnosis: Same  Procedure: Right inferior minimally invasive parathyroidectomy  Surgeon:  Armandina Gemma, MD  Assistant:  Alphonsa Overall, MD  Anesthesia: General endotracheal  Estimated blood loss: Minimal  Preparation: ChloraPrep  Indications: The patient is referred by Dr. Alexis Frock for surgical evaluation and management of suspected primary hyperparathyroidism. Patient's primary care physician is Dr. Martinique Shirley.  Clinical history had started with an episode of appendicitis managed by one of my partners several months ago. CT scan for appendicitis demonstrated a large kidney stone. As part of her evaluation for nephrolithiasis, laboratory studies were obtained in June 2020. This showed serum calcium levels ranging from 11.4-12.2. Intact PTH level was elevated at 161. Vitamin D levels were normal. Patient has had a history of nephrolithiasis for many years. She also notes chronic fatigue. She notes frequent urination. Recent bone density scan shows osteoporosis.  4D-CT scan localized a right inferior adenoma.  Patient now comes to surgery for resection.  Procedure: The patient was prepared in the pre-operative holding area. The patient was brought to the operating room and placed in a supine position on the operating room table. Following administration of general anesthesia, the patient was positioned and then prepped and draped in the usual strict aseptic fashion. After ascertaining that an adequate level of anesthesia been achieved, a neck incision was made with a #15 blade. Dissection was carried through subcutaneous tissues and platysma. Hemostasis was obtained with the electrocautery. Skin flaps were developed circumferentially and a Weitlander retractor was placed for exposure.  Strap muscles were incised in the midline. Strap muscles were reflected lateralley exposing the  thyroid lobe. With gentle blunt dissection the thyroid lobe was mobilized.  Dissection was carried through adipose tissue and an enlarged parathyroid gland was identified. It was gently mobilized. Vascular structures were divided between ligaclips. Care was taken to avoid the recurrent laryngeal nerve and the esophagus. The parathyroid gland was completely excised. It was submitted to pathology where frozen section by Dr. Vicente Males confirmed parathyroid tissue consistent with adenoma.  Neck was irrigated with warm saline and good hemostasis was noted. Fibrillar was placed in the operative field. Strap muscles were approximated in the midline with interrupted 3-0 Vicryl sutures. Platysma was closed with interrupted 3-0 Vicryl sutures. Marcaine was infiltrated circumferentially. Skin was closed with a running 4-0 Monocryl subcuticular suture. Wound was washed and dried and Dermabond was applied. Patient was awakened from anesthesia and brought to the recovery room. The patient tolerated the procedure well.   Armandina Gemma, MD [facility] Office: [phone]

## 2019-02-23 NOTE — Anesthesia Procedure Notes (Signed)
Procedure Name: Intubation Date/Time: 02/23/2019 10:25 AM Performed by: Eben Burow, CRNA Pre-anesthesia Checklist: Patient identified, Emergency Drugs available, Suction available, Patient being monitored and Timeout performed Patient Re-evaluated:Patient Re-evaluated prior to induction Oxygen Delivery Method: Circle system utilized Preoxygenation: Pre-oxygenation with 100% oxygen Induction Type: IV induction Ventilation: Mask ventilation without difficulty Laryngoscope Size: Mac and 4 Grade View: Grade I Tube type: Oral Tube size: 7.0 mm Number of attempts: 1 Airway Equipment and Method: Stylet Placement Confirmation: ETT inserted through vocal cords under direct vision,  positive ETCO2 and breath sounds checked- equal and bilateral Secured at: 22 cm Tube secured with: Tape Dental Injury: Teeth and Oropharynx as per pre-operative assessment

## 2019-02-23 NOTE — Transfer of Care (Signed)
Immediate Anesthesia Transfer of Care Note  Patient: [facility]  Procedure(s) Performed: RIGHT PARATHYROIDECTOMY (Right Neck)  Patient Location: PACU  Anesthesia Type:General  Level of Consciousness: awake, alert  and oriented  Airway & Oxygen Therapy: Patient Spontanous Breathing and Patient connected to face mask oxygen  Post-op Assessment: Report given to RN and Post -op Vital signs reviewed and stable  Post vital signs: Reviewed and stable  Last Vitals:  Vitals Value Taken Time  BP    Temp    Pulse 80 02/23/19 1127  Resp    SpO2 100 % 02/23/19 1127  Vitals shown include unvalidated device data.  Last Pain:  Vitals:   02/23/19 0951  TempSrc:   PainSc: 0-No pain         Complications: No apparent anesthesia complications

## 2019-02-23 NOTE — Interval H&P Note (Signed)
History and Physical Interval Note:  02/23/2019 9:56 AM  [facility]  has presented today for surgery, with the diagnosis of PRIMARY HYPERPARATHYROIDISM.  The various methods of treatment have been discussed with the patient and family. After consideration of risks, benefits and other options for treatment, the patient has consented to    Procedure(s): RIGHT INFERIOR PARATHYROIDECTOMY (Right) as a surgical intervention.    The patient's history has been reviewed, patient examined, no change in status, stable for surgery.  I have reviewed the patient's chart and labs.  Questions were answered to the patient's satisfaction.    Armandina Gemma, MD [facility] Office: [place]

## 2019-02-23 NOTE — Anesthesia Postprocedure Evaluation (Signed)
Anesthesia Post Note  Patient: [facility]  Procedure(s) Performed: RIGHT PARATHYROIDECTOMY (Right Neck)     Patient location during evaluation: PACU Anesthesia Type: General Level of consciousness: awake and alert, oriented and awake Pain management: pain level controlled Vital Signs Assessment: post-procedure vital signs reviewed and stable Respiratory status: spontaneous breathing, nonlabored ventilation and respiratory function stable Cardiovascular status: blood pressure returned to baseline and stable Postop Assessment: no apparent nausea or vomiting Anesthetic complications: no    Last Vitals:  Vitals:   02/23/19 1335 02/23/19 1405  BP: (!) 146/69 130/67  Pulse: 66 67  Resp: 16 14  Temp:    SpO2: 96% 93%    Last Pain:  Vitals:   02/23/19 1405  TempSrc:   PainSc: 5                  Catalina Gravel

## 2019-02-23 NOTE — Anesthesia Preprocedure Evaluation (Addendum)
Anesthesia Evaluation  Patient identified by MRN, date of birth, ID band Patient awake    Reviewed: Allergy & Precautions, NPO status , Patient's Chart, lab work & pertinent test results  Airway Mallampati: II  TM Distance: >3 FB Neck ROM: Full    Dental  (+) Teeth Intact, Dental Advisory Given, Caps   Pulmonary neg pulmonary ROS,    Pulmonary exam normal breath sounds clear to auscultation       Cardiovascular hypertension, Pt. on medications (-) anginaNormal cardiovascular exam Rhythm:Regular Rate:Normal     Neuro/Psych negative neurological ROS     GI/Hepatic Neg liver ROS, GERD  ,  Endo/Other  Obesity PRIMARY HYPERPARATHYROIDISM  Renal/GU negative Renal ROS     Musculoskeletal negative musculoskeletal ROS (+)   Abdominal   Peds  Hematology negative hematology ROS (+)   Anesthesia Other Findings Day of surgery medications reviewed with the patient.  Reproductive/Obstetrics                            Anesthesia Physical  Anesthesia Plan  ASA: II  Anesthesia Plan: General   Post-op Pain Management:    Induction: Intravenous  PONV Risk Score and Plan: 3 and Dexamethasone, Ondansetron and Midazolam  Airway Management Planned: Oral ETT  Additional Equipment:   Intra-op Plan:   Post-operative Plan: Extubation in OR  Informed Consent: I have reviewed the patients History and Physical, chart, labs and discussed the procedure including the risks, benefits and alternatives for the proposed anesthesia with the patient or authorized representative who has indicated his/her understanding and acceptance.     Dental advisory given  Plan Discussed with: CRNA  Anesthesia Plan Comments:         Anesthesia Quick Evaluation

## 2019-02-25 ENCOUNTER — Other Ambulatory Visit: Payer: Self-pay

## 2019-02-25 ENCOUNTER — Encounter: Payer: Self-pay | Admitting: Family Medicine

## 2019-02-25 ENCOUNTER — Emergency Department (HOSPITAL_COMMUNITY)
Admission: EM | Admit: 2019-02-25 | Discharge: 2019-02-25 | Disposition: A | Discharge status: Home / Self Care | Payer: Medicare Other | Attending: Emergency Medicine | Admitting: Emergency Medicine

## 2019-02-25 ENCOUNTER — Encounter (HOSPITAL_COMMUNITY): Payer: Self-pay | Admitting: Emergency Medicine

## 2019-02-25 DIAGNOSIS — Z79899 Other long term (current) drug therapy: Secondary | ICD-10-CM | POA: Diagnosis not present

## 2019-02-25 DIAGNOSIS — R200 Anesthesia of skin: Secondary | ICD-10-CM | POA: Diagnosis present

## 2019-02-25 DIAGNOSIS — I10 Essential (primary) hypertension: Secondary | ICD-10-CM | POA: Diagnosis not present

## 2019-02-25 DIAGNOSIS — R202 Paresthesia of skin: Secondary | ICD-10-CM | POA: Diagnosis not present

## 2019-02-25 LAB — CBC WITH DIFFERENTIAL/PLATELET
Abs Immature Granulocytes: 0.01 10*3/uL (ref 0.00–0.07)
Basophils Absolute: 0 10*3/uL (ref 0.0–0.1)
Basophils Relative: 1 %
Eosinophils Absolute: 0.1 10*3/uL (ref 0.0–0.5)
Eosinophils Relative: 2 %
HCT: 41.7 % (ref 36.0–46.0)
Hemoglobin: 13.7 g/dL (ref 12.0–15.0)
Immature Granulocytes: 0 %
Lymphocytes Relative: 36 %
Lymphs Abs: 2.1 10*3/uL (ref 0.7–4.0)
MCH: 30.3 pg (ref 26.0–34.0)
MCHC: 32.9 g/dL (ref 30.0–36.0)
MCV: 92.3 fL (ref 80.0–100.0)
Monocytes Absolute: 0.5 10*3/uL (ref 0.1–1.0)
Monocytes Relative: 9 %
Neutro Abs: 3.1 10*3/uL (ref 1.7–7.7)
Neutrophils Relative %: 52 %
Platelets: 211 10*3/uL (ref 150–400)
RBC: 4.52 MIL/uL (ref 3.87–5.11)
RDW: 13.5 % (ref 11.5–15.5)
WBC: 5.8 10*3/uL (ref 4.0–10.5)
nRBC: 0 % (ref 0.0–0.2)

## 2019-02-25 LAB — BASIC METABOLIC PANEL
Anion gap: 9 (ref 5–15)
BUN: 16 mg/dL (ref 8–23)
CO2: 28 mmol/L (ref 22–32)
Calcium: 9.4 mg/dL (ref 8.9–10.3)
Chloride: 105 mmol/L (ref 98–111)
Creatinine, Ser: 0.7 mg/dL (ref 0.44–1.00)
GFR calc Af Amer: >60 mL/min (ref >60)
GFR calc non Af Amer: >60 mL/min (ref >60)
Glucose, Bld: 91 mg/dL (ref 70–99)
Potassium: 3.7 mmol/L (ref 3.5–5.1)
Sodium: 142 mmol/L (ref 135–145)

## 2019-02-25 LAB — MAGNESIUM: Magnesium: 1.6 mg/dL — ABNORMAL LOW (ref 1.7–2.4)

## 2019-02-25 MED ORDER — MAGNESIUM SULFATE 2 GM/50ML IV SOLN
2.0000 g | Freq: Once | INTRAVENOUS | Status: AC
  Administered 2019-02-25: 08:00:00 2 g via INTRAVENOUS
  Filled 2019-02-25: qty 50

## 2019-02-25 NOTE — ED Triage Notes (Signed)
Pt reports having surgery on 02/23/2019 and then this morning woke to numbness in hands and feet. Pt was advised by surgeon to come to ED for those symptoms.

## 2019-02-25 NOTE — Discharge Instructions (Addendum)
Your potassium and calcium levels were low today.  Your magnesium was slightly low which we have given you replacement for.  I recommend follow-up with your primary care physician if the symptoms continue.  Otherwise your labs were reassuring today.

## 2019-02-25 NOTE — ED Provider Notes (Addendum)
TIME SEEN: 6:30 AM  CHIEF COMPLAINT: Tingling in the fingers and feet  HPI: Patient is a 69 year old female who underwent right parathyroidectomy by Dr. Harlow Asa on February 23, 2019.  States woke up this morning and had tingling in her hands and feet.  No numbness or weakness.  States yesterday she had a feeling of vertigo as well and states she felt "heavy all over" today.  No fever.  No pain over incision site, redness, warmth or drainage.  No headache, neck pain or back pain.  No bowel or bladder incontinence.  No chest pain or shortness of breath.  She is not a diabetic.  No history of neuropathy.  ROS: See HPI Constitutional: no fever  Eyes: no drainage  ENT: no runny nose   Cardiovascular:  no chest pain  Resp: no SOB  GI: no vomiting GU: no dysuria Integumentary: no rash  Allergy: no hives  Musculoskeletal: no leg swelling  Neurological: no slurred speech ROS otherwise negative  PAST MEDICAL HISTORY/PAST SURGICAL HISTORY:  Past Medical History:  Diagnosis Date  . Acute appendicitis 07/07/2018  . Allergy   . Anemia   . GERD (gastroesophageal reflux disease)   . Hyperlipidemia   . Hypertension   . Left ureteral stone   . Osteoporosis 02/18/2019  . Thyroid disease   . Wears glasses     MEDICATIONS:  Prior to Admission medications   Medication Sig Start Date End Date Taking? Authorizing Provider  amLODipine (NORVASC) 5 MG tablet Take 1 tablet (5 mg total) by mouth daily. 02/17/19   Shirley, [place], DO  Ascorbic Acid (VITAMIN C) 1000 MG tablet Take 1,000 mg by mouth daily.     [provider]  Biotin w/ Vitamins C & E (HAIR/SKIN/NAILS PO) Take 1 tablet by mouth daily.    [provider]  cholecalciferol (VITAMIN D) 25 MCG (1000 UNIT) tablet Take 1,000 Units by mouth daily.     [provider]  ELDERBERRY PO Take 50 mg by mouth daily.     [provider]  hydrocortisone 2.5 % cream Apply 1 application topically daily as needed (itching).     [provider]  Lysine 1000 MG TABS Take 1,000 mg by mouth daily.    [provider]  magnesium oxide (MAG-OX) 400 MG tablet Take 400 mg by mouth daily.    [provider]  Multiple Vitamins-Minerals (MULTIVITAMIN ADULT PO) Take 1 tablet by mouth daily.    [provider]  Omega-3 Fatty Acids (OMEGA 3 500) 500 MG CAPS Take 500 mg by mouth daily.    [provider]  polycarbophil (FIBERCON) 625 MG tablet Take 625 mg by mouth daily.    [provider]  traMADol (ULTRAM) 50 MG tablet Take 1-2 tablets (50-100 mg total) by mouth every 6 (six) hours as needed. 02/23/19   Armandina Gemma, MD  Turmeric 500 MG TABS Take 500 mg by mouth daily.    [provider]  vitamin B-12 (CYANOCOBALAMIN) 1000 MCG tablet Take 1,000 mcg by mouth daily.    [provider]  amLODipine (NORVASC) 5 MG tablet Take 1 tablet by mouth once daily Patient taking differently: Take 5 mg by mouth daily after breakfast.  05/04/18   Caroline More, DO  amLODipine (NORVASC) 5 MG tablet Take 1 tablet by mouth once daily 07/31/18   Shirley, [place], DO    ALLERGIES:  No Known Allergies  SOCIAL HISTORY:  Social History   Tobacco Use  . Smoking status: Never  Smoker  . Smokeless tobacco: Never Used  Substance Use Topics  . Alcohol use: No    FAMILY HISTORY: Family History  Problem Relation Age of Onset  . Hypertension Mother   . Osteoporosis Mother   . Stroke Mother   . Alzheimer's disease Father   . Heart disease Father   . Hypertension Father   . Asthma Brother   . Depression Maternal Grandfather   . Breast cancer Neg Hx   . Colon cancer Neg Hx   . Esophageal cancer Neg Hx   . Rectal cancer Neg Hx   . Stomach cancer Neg Hx     EXAM: BP (!) 150/91   Pulse 64   Temp 98.2 F (36.8 C) (Oral)   Resp 19   Ht 5' 4.75" (1.645 m)   Wt 81.6 kg   SpO2 97%   BMI 30.19 kg/m  CONSTITUTIONAL: Alert and oriented and responds appropriately to questions.  Well-appearing; well-nourished HEAD: Normocephalic EYES: Conjunctivae clear, pupils appear equal, EOM appear intact ENT: normal nose; moist mucous membranes NECK: Supple, normal ROM, anterior neck incision site is clean, dry and intact without redness, warmth, drainage or bleeding CARD: RRR; S1 and S2 appreciated; no murmurs, no clicks, no rubs, no gallops RESP: Normal chest excursion without splinting or tachypnea; breath sounds clear and equal bilaterally; no wheezes, no rhonchi, no rales, no hypoxia or respiratory distress, speaking full sentences ABD/GI: Normal bowel sounds; non-distended; soft, non-tender, no rebound, no guarding, no peritoneal signs, no hepatosplenomegaly BACK:  The back appears normal EXT: Normal ROM in all joints; no deformity noted, no edema; no cyanosis, normal capillary refill in fingers and toes, 2+ radial and DP pulses bilaterally SKIN: Normal color for age and race; warm; no rash on exposed skin NEURO: Moves all extremities equally, normal sensation diffusely, normal speech, negative Trousseau sign, negative Chvostek's sign PSYCH: The patient's mood and manner are appropriate.   MEDICAL DECISION MAKING: Patient here with tingling in her toes and fingers.  Normal capillary refill, normal pulses, normal sensation.  Doubt stroke, spinal pathology.  Will check electrolytes given she is status post right parathyroidectomy.  Incision site does not appear infected.  She has no complaints of pain.  ED PROGRESS: Labs show hemoglobin of 13.  Potassium normal at 3.7, calcium normal at 9.4.  Magnesium slightly low at 1.6.  Will give replacement.  Discussed with patient that paresthesias could be caused by drop in calcium levels.  Calcium levels are normal but was 12.4 on 02/21/2019.  Recommend follow-up with PCP if symptoms continue for monitoring of calcium levels.   Patient reports her paresthesias have completely resolved in the left hand.  Still has a mild amount in the right  hand.  She feels her symptoms are improving and she is comfortable with discharge home with outpatient follow-up.  At this time, I do not feel there is any life-threatening condition present. I have reviewed, interpreted and discussed all results (EKG, imaging, lab, urine as appropriate) and exam findings with patient/family. I have reviewed nursing notes and appropriate previous records.  I feel the patient is safe to be discharged home without further emergent workup and can continue workup as an outpatient as needed. Discussed usual and customary return precautions. Patient/family verbalize understanding and are comfortable with this plan.  Outpatient follow-up has been provided as needed. All questions have been answered.   Pari Cryan was evaluated in Emergency Department on 02/25/2019 for the symptoms described in the history of present illness. She  was evaluated in the context of the global COVID-19 pandemic, which necessitated consideration that the patient might be at risk for infection with the SARS-CoV-2 virus that causes COVID-19. Institutional protocols and algorithms that pertain to the evaluation of patients at risk for COVID-19 are in a state of rapid change based on information released by regulatory bodies including the CDC and federal and state organizations. These policies and algorithms were followed during the patient's care in the ED.  Patient was seen wearing N95, face shield, gloves.    Patton Swisher, Delice Bison, DO 02/25/19 [place], Delice Bison, DO 02/25/19 [phone]

## 2019-02-26 ENCOUNTER — Other Ambulatory Visit: Payer: Self-pay

## 2019-02-26 ENCOUNTER — Encounter: Payer: Self-pay | Admitting: *Deleted

## 2019-02-26 ENCOUNTER — Ambulatory Visit (INDEPENDENT_AMBULATORY_CARE_PROVIDER_SITE_OTHER): Payer: Medicare Other | Admitting: Family Medicine

## 2019-02-26 VITALS — BP 138/62 | HR 74 | Wt 182.6 lb

## 2019-02-26 DIAGNOSIS — E210 Primary hyperparathyroidism: Secondary | ICD-10-CM

## 2019-02-26 LAB — SURGICAL PATHOLOGY

## 2019-02-26 NOTE — Assessment & Plan Note (Signed)
Patient is likely experiencing paresthesias due to the abrupt decrease in her calcium level from 12.4-9.4.  Her body set point of calcium is likely higher than normal, so this normal level feels low.  Reassured patient that this should resolve with time.  Her surgeon may see fit to prescribe calcium to alleviate patient's acute symptoms, but I am reassured that her symptoms are improving.  We will recheck a BMP and magnesium level today and replete as needed.

## 2019-02-26 NOTE — Progress Notes (Signed)
   Subjective:    Maria Wall - 69 y.o. female MRN [id number]  Date of birth: 1950/07/23  CC:  [facility] is here for paresthesias.  HPI: Ms. Botkin reports that she had a parathyroidectomy on 1/22, she felt nauseated and fatigued the following day.  She does not think that she ate or drink very much that day due to feeling bad.  Yesterday, she began to feel tingling in her hands and feet and presented to the ED due to this concern.  They checked a BMP and magnesium level and found normal electrolytes, with a reduction of calcium from 12.4 prior to the surgery to 9.4.  Her magnesium was low, so this was repleted.  Patient has called her surgeon to notify him of this change and is awaiting his call back.  She says that since her ED visit yesterday, she has had some improvement of her paresthesias and no longer feels the feet, although she can feel them slightly in her bilateral upper extremities.  She denies any other symptoms and overall feels well.  Health Maintenance:  Health Maintenance Due  Topic Date Due  . TETANUS/TDAP  04/10/1969    -  reports that she has never smoked. She has never used smokeless tobacco. - Review of Systems: Per HPI. - Past Medical History: Patient Active Problem List   Diagnosis Date Noted  . Hyperparathyroidism, primary ([place]) 02/18/2019  . Osteoporosis 02/18/2019  . Hypercalcemia 11/15/2018  . Kidney stone on left side 07/26/2018  . Essential hypertension 07/22/2016   - Medications: reviewed and updated   Objective:   Physical Exam BP 138/62   Pulse 74   Wt 182 lb 9.6 oz (82.8 kg)   SpO2 96%   BMI 30.62 kg/m  Gen: NAD, alert, cooperative with exam, well-appearing CV: RRR, good S1/S2, no murmur Resp: CTABL, no wheezes, non-labored Psych: good insight, alert and oriented     Assessment & Plan:   Hyperparathyroidism, primary ([place]) Patient is likely experiencing paresthesias due to the abrupt decrease in her calcium level from 12.4-9.4.  Her body  set point of calcium is likely higher than normal, so this normal level feels low.  Reassured patient that this should resolve with time.  Her surgeon may see fit to prescribe calcium to alleviate patient's acute symptoms, but I am reassured that her symptoms are improving.  We will recheck a BMP and magnesium level today and replete as needed.    Maia Breslow, M.D. 02/26/2019, 12:04 PM PGY-3, [place]

## 2019-02-26 NOTE — Patient Instructions (Signed)
It was nice meeting you today Ms. Bath!  Your paresthesias are likely due to a reduction in your body's calcium level, which is now normal.  Your body is used to a higher calcium level and thinks that this normal level is actually low.  Today, we will recheck this level and recheck your magnesium.  If you do not hear from your surgeon in the next day or two or if your symptoms worsen, please let us know.  If you have any questions or concerns, please feel free to call the clinic.   Be well,  Dr. Shan Levans

## 2019-02-27 ENCOUNTER — Telehealth: Payer: Self-pay

## 2019-02-27 LAB — BASIC METABOLIC PANEL
BUN/Creatinine Ratio: 25 (ref 12–28)
BUN: 18 mg/dL (ref 8–27)
CO2: 26 mmol/L (ref 20–29)
Calcium: 9.7 mg/dL (ref 8.7–10.3)
Chloride: 106 mmol/L (ref 96–106)
Creatinine, Ser: 0.73 mg/dL (ref 0.57–1.00)
GFR calc Af Amer: 98 mL/min/{1.73_m2} (ref >59)
GFR calc non Af Amer: 85 mL/min/{1.73_m2} (ref >59)
Glucose: 89 mg/dL (ref 65–99)
Potassium: 4.2 mmol/L (ref 3.5–5.2)
Sodium: 145 mmol/L — ABNORMAL HIGH (ref 134–144)

## 2019-02-27 LAB — MAGNESIUM: Magnesium: 1.7 mg/dL (ref 1.6–2.3)

## 2019-02-27 NOTE — Telephone Encounter (Signed)
-----   Message from [name], MD sent at 02/27/2019  8:36 AM EST ----- Would you let Maria Wall know that her labs are normal?  Thank you!

## 2019-02-27 NOTE — Telephone Encounter (Signed)
Pt informed. Maria Wall, CMA  

## 2019-03-16 ENCOUNTER — Telehealth: Payer: Self-pay

## 2019-03-16 NOTE — Telephone Encounter (Signed)
Patient calls nurse line regarding slight pink-tinged urine. Onset this AM. Patient states that she has a history of large kidney stones, with cystoscopy and stent placement in June 2020. Pt reports no pain or other UTI related symptoms. No appointments available for this afternoon. Scheduled patient office visit for Monday 2/15. Advised patient that if she begins having symptoms or bleeding increases to go to urgent care or emergency department.    To PCP  Talbot Grumbling, RN

## 2019-03-19 ENCOUNTER — Ambulatory Visit (INDEPENDENT_AMBULATORY_CARE_PROVIDER_SITE_OTHER): Payer: Medicare Other | Admitting: Family Medicine

## 2019-03-19 ENCOUNTER — Other Ambulatory Visit: Payer: Self-pay

## 2019-03-19 VITALS — BP 130/80 | HR 64 | Wt 190.4 lb

## 2019-03-19 DIAGNOSIS — M818 Other osteoporosis without current pathological fracture: Secondary | ICD-10-CM

## 2019-03-19 DIAGNOSIS — R82998 Other abnormal findings in urine: Secondary | ICD-10-CM | POA: Diagnosis present

## 2019-03-19 LAB — POCT UA - MICROSCOPIC ONLY

## 2019-03-19 LAB — POCT URINALYSIS DIP (MANUAL ENTRY)
Bilirubin, UA: NEGATIVE
Glucose, UA: NEGATIVE mg/dL
Ketones, POC UA: NEGATIVE mg/dL
Leukocytes, UA: NEGATIVE
Nitrite, UA: NEGATIVE
Protein Ur, POC: NEGATIVE mg/dL
Spec Grav, UA: 1.015 (ref 1.010–1.025)
Urobilinogen, UA: 0.2 E.U./dL
pH, UA: 7 (ref 5.0–8.0)

## 2019-03-19 NOTE — Assessment & Plan Note (Addendum)
Patient's urine returned with some red blood cells on micro, however, urinalysis negative for any unusual findings.  Patient does not have any pain at this time.  Patient also reports that she went to the gym the day before Friday, which she had a typical workout.  She went to the gym 4 days last week postop.  No brown-colored urine.  No muscle aches.  Given quick resolution, will not pursue other work-up at this time.  Likely due to stone versus stent. Patient to call if she has any pain or recurrence of pink-tinged urine.

## 2019-03-19 NOTE — Progress Notes (Addendum)
   CHIEF COMPLAINT / HPI:  Pink colored Urine  Was planning to out of town today but wanted to make sure things were okay. Denies any pain, no pain with urination. No fevers, no chills. Patient reports that she never experienced any pain with her stone, it was incidentally found on CT prior to appendectomy.  Patient reports that pink-colored urine was present on Friday only and remained yellow over the weekend.  PERTINENT  PMH / PSH: History of kidney stones, lithotripsy, recent parathyroidectomy   OBJECTIVE: BP 130/80   Pulse 64   Wt 190 lb 6.4 oz (86.4 kg)   SpO2 100%   BMI 31.93 kg/m   Well-appearing, no acute distress Back: No CVA tenderness.  No pain with palpation at midline or lateral. Abdomen: No tenderness to palpation.  ASSESSMENT / PLAN:  Pink-colored urine Patient's urine returned with some red blood cells on micro, however, urinalysis negative for any unusual findings.  Patient does not have any pain at this time.  Patient also reports that she went to the gym the day before Friday, which she had a typical workout.  She went to the gym 4 days last week postop.  No brown-colored urine.  No muscle aches.  Given quick resolution, will not pursue other work-up at this time.  Likely due to stone versus stent. Patient to call if she has any pain or recurrence of pink-tinged urine.  Osteoporosis Patient also asked about information for osteoporosis given hyperparathyroidism.  Provided information for patient to read.  Patient to call if she has any other further questions.   Wilber Oliphant, MD [place]

## 2019-03-19 NOTE — Patient Instructions (Signed)
Osteoporosis  Osteoporosis is thinning and loss of density in your bones. Osteoporosis makes bones more brittle and fragile and more likely to break (fracture). Over time, osteoporosis can cause your bones to become so weak that they fracture after a minor fall. Bones in the hip, wrist, and spine are most likely to fracture due to osteoporosis. What are the causes? The exact cause of this condition is not known. What increases the risk? You may be at greater risk for osteoporosis if you:  Have a family history of the condition.  Have poor nutrition.  Use steroid medicines, such as prednisone.  Are female.  Are age 54 or older.  Smoke or have a history of smoking.  Are not physically active (are sedentary).  Are white (Caucasian) or of Asian descent.  Have a small body frame.  Take certain medicines, such as antiseizure medicines. What are the signs or symptoms? A fracture might be the first sign of osteoporosis, especially if the fracture results from a fall or injury that usually would not cause a bone to break. Other signs and symptoms include:  Pain in the neck or low back.  Stooped posture.  Loss of height. How is this diagnosed? This condition may be diagnosed based on:  Your medical history.  A physical exam.  A bone mineral density test, also called a DXA or DEXA test (dual-energy X-ray absorptiometry test). This test uses X-rays to measure the amount of minerals in your bones. How is this treated? The goal of treatment is to strengthen your bones and lower your risk for a fracture. Treatment may involve:  Making lifestyle changes, such as: ? Including foods with more calcium and vitamin D in your diet. ? Doing weight-bearing and muscle-strengthening exercises. ? Stopping tobacco use. ? Limiting alcohol intake.  Taking medicine to slow the process of bone loss or to increase bone density.  Taking daily supplements of calcium and vitamin D.  Taking  hormone replacement medicines, such as estrogen for women and testosterone for men.  Monitoring your levels of calcium and vitamin D. Follow these instructions at home:  Activity  Exercise as told by your health care provider. Ask your health care provider what exercises and activities are safe for you. You should do: ? Exercises that make you work against gravity (weight-bearing exercises), such as tai chi, yoga, or walking. ? Exercises to strengthen muscles, such as lifting weights. Lifestyle  Limit alcohol intake to no more than 1 drink a day for nonpregnant women and 2 drinks a day for men. One drink equals 12 oz of beer, 5 oz of wine, or 1 oz of hard liquor.  Do not use any products that contain nicotine or tobacco, such as cigarettes and e-cigarettes. If you need help quitting, ask your health care provider. Preventing falls  Use devices to help you move around (mobility aids) as needed, such as canes, walkers, scooters, or crutches.  Keep rooms well-lit and clutter-free.  Remove tripping hazards from walkways, including cords and throw rugs.  Install grab bars in bathrooms and safety rails on stairs.  Use rubber mats in the bathroom and other areas that are often wet or slippery.  Wear closed-toe shoes that fit well and support your feet. Wear shoes that have rubber soles or low heels.  Review your medicines with your health care provider. Some medicines can cause dizziness or changes in blood pressure, which can increase your risk of falling. General instructions  Include calcium and vitamin D in  your diet. Calcium is important for bone health, and vitamin D helps your body to absorb calcium. Good sources of calcium and vitamin D include: ? Certain fatty fish, such as salmon and tuna. ? Products that have calcium and vitamin D added to them (fortified products), such as fortified cereals. ? Egg yolks. ? Cheese. ? Liver.  Take over-the-counter and prescription medicines  only as told by your health care provider.  Keep all follow-up visits as told by your health care provider. This is important. Contact a health care provider if:  You have never been screened for osteoporosis and you are: ? A woman who is age 53 or older. ? A man who is age 87 or older. Get help right away if:  You fall or injure yourself. Summary  Osteoporosis is thinning and loss of density in your bones. This makes bones more brittle and fragile and more likely to break (fracture),even with minor falls.  The goal of treatment is to strengthen your bones and reduce your risk for a fracture.  Include calcium and vitamin D in your diet. Calcium is important for bone health, and vitamin D helps your body to absorb calcium.  Talk with your health care provider about screening for osteoporosis if you are a woman who is age 59 or older, or a man who is age 19 or older. This information is not intended to replace advice given to you by your health care provider. Make sure you discuss any questions you have with your health care provider. Document Revised: 12/31/2016 Document Reviewed: 11/12/2016 Elsevier Patient Education  2020 [place].  Preventing Osteoporosis, Adult Osteoporosis is a condition that causes the bones to lose density. This means that the bones become thinner, and the normal spaces in bone tissue become larger. Low bone density can make the bones weak and cause them to break more easily. Osteoporosis cannot always be prevented, but you can take steps to lower your risk of developing this condition. How can this condition affect me? If you develop osteoporosis, you will be more likely to break bones in your wrist, spine, or hip. Even a minor accident or injury can be enough to break weak bones. The bones will also be slower to heal. Osteoporosis can cause other problems as well, such as a stooped posture or trouble with movement. Osteoporosis can occur with aging. As you  get older, you may lose bone tissue more quickly, or it may be replaced more slowly. Osteoporosis is more likely to develop if you have poor nutrition or do not get enough calcium or vitamin D. Other lifestyle factors can also play a role. By eating a well-balanced diet and making lifestyle changes, you can help keep your bones strong and healthy, lowering your chances of developing osteoporosis. What can increase my risk? The following factors may make you more likely to develop osteoporosis:  Having a family history of the condition.  Having poor nutrition or not getting enough calcium or vitamin D.  Using certain medicines, such as steroid medicines or antiseizure medicines.  Being any of the following: ? 70 years of age or older. ? Female. ? A woman who has gone through menopause (is postmenopausal). ? White (Caucasian) or of Asian descent.  Smoking or having a history of smoking.  Not being physically active (being sedentary).  Having a small body frame. What actions can I take to prevent this?  Get enough calcium   Make sure you get enough calcium every day. Calcium  is the most important mineral for bone health. Most people can get enough calcium from their diet, but supplements may be recommended for people who are at risk for osteoporosis. Follow these guidelines: ? If you are age 9 or younger, aim to get 1,000 mg of calcium every day. ? If you are older than age 30, aim to get 1,200 mg of calcium every day.  Good sources of calcium include: ? Dairy products, such as low-fat or nonfat milk, cheese, and yogurt. ? Dark green leafy vegetables, such as bok choy and broccoli. ? Foods that have had calcium added to them (calcium-fortified foods), such as orange juice, cereal, bread, soy beverages, and tofu products. ? Nuts, such as almonds.  Check nutrition labels to see how much calcium is in a food or drink. Get enough vitamin D  Try to get enough vitamin D every day.  Vitamin D is the most essential vitamin for bone health. It helps the body absorb calcium. Follow these guidelines for how much vitamin D to get from food: ? If you are age 76 or younger, aim to get at least 600 international units (IU) every day. Your health care provider may suggest more. ? If you are older than age 80, aim to get at least 800 international units every day. Your health care provider may suggest more.  Good sources of vitamin D in your diet include: ? Egg yolks. ? Oily fish, such as salmon, sardines, and tuna. ? Milk and cereal fortified with vitamin D.  Your body also makes vitamin D when you are out in the sun. Exposing the bare skin on your face, arms, legs, or back to the sun for no more than 30 minutes a day, 2 times a week is more than enough. Beyond that, make sure you use sunblock to protect your skin from sunburn, which increases your risk for skin cancer. Exercise  Stay active and get exercise every day.  Ask your health care provider what types of exercise are best for you. Weight-bearing and strength-building activities are important for building and maintaining healthy bones. Some examples of these types of activities include: ? Walking and hiking. ? Jogging and running. ? Dancing. ? Gym exercises. ? Lifting weights. ? Tennis and racquetball. ? Climbing stairs. ? Aerobics. Make other lifestyle changes  Do not use any products that contain nicotine or tobacco, such as cigarettes, e-cigarettes, and chewing tobacco. If you need help quitting, ask your health care provider.  Lose weight if you are overweight.  If you drink alcohol: ? Limit how much you use to:  0-1 drink a day for nonpregnant women.  0-2 drinks a day for men. ? Be aware of how much alcohol is in your drink. In the [place] one drink equals one 12 oz bottle of beer (355 mL), one 5 oz glass of wine (148 mL), or one 1 oz glass of hard liquor (44 mL). Where to find support If you need help  making changes to prevent osteoporosis, talk with your health care provider. You can ask for a referral to a diet and nutrition specialist (dietitian) and a physical therapist. Where to find more information Learn more about osteoporosis from:  NIH Osteoporosis and Related [place]: www.bones.SouthExposed.es  [place] Office on [employer]: [place].tn  [place]: EquipmentWeekly.com.ee Summary  Osteoporosis is a condition that causes weak bones that are more likely to break.  Eat a healthy diet, making sure you get enough calcium and  vitamin D, and stay active by getting regular exercise to help prevent osteoporosis.  Other ways to reduce your risk of osteoporosis include maintaining a healthy weight and avoiding alcohol and products that contain nicotine or tobacco. This information is not intended to replace advice given to you by your health care provider. Make sure you discuss any questions you have with your health care provider. Document Revised: 08/18/2018 Document Reviewed: 08/18/2018 Elsevier Patient Education  [place].

## 2019-03-19 NOTE — Assessment & Plan Note (Signed)
Patient also asked about information for osteoporosis given hyperparathyroidism.  Provided information for patient to read.  Patient to call if she has any other further questions.

## 2019-04-04 ENCOUNTER — Encounter: Payer: Self-pay | Admitting: Family Medicine

## 2019-04-11 ENCOUNTER — Encounter: Payer: Self-pay | Admitting: Family Medicine

## 2019-04-23 ENCOUNTER — Other Ambulatory Visit: Payer: Self-pay

## 2019-04-23 ENCOUNTER — Ambulatory Visit (INDEPENDENT_AMBULATORY_CARE_PROVIDER_SITE_OTHER): Payer: Medicare Other | Admitting: Family Medicine

## 2019-04-23 ENCOUNTER — Encounter: Payer: Self-pay | Admitting: Family Medicine

## 2019-04-23 VITALS — BP 124/62 | HR 66 | Ht 64.0 in | Wt 190.5 lb

## 2019-04-23 DIAGNOSIS — E210 Primary hyperparathyroidism: Secondary | ICD-10-CM | POA: Diagnosis present

## 2019-04-23 DIAGNOSIS — R202 Paresthesia of skin: Secondary | ICD-10-CM

## 2019-04-23 DIAGNOSIS — I10 Essential (primary) hypertension: Secondary | ICD-10-CM

## 2019-04-23 DIAGNOSIS — M818 Other osteoporosis without current pathological fracture: Secondary | ICD-10-CM

## 2019-04-23 MED ORDER — TETANUS-DIPHTH-ACELL PERTUSSIS 5-2.5-18.5 LF-MCG/0.5 IM SUSP: 0.5000 mL | Freq: Once | INTRAMUSCULAR | 0 refills | Status: AC

## 2019-04-23 NOTE — Patient Instructions (Addendum)
Thank you for coming to see me today. It was a pleasure! Today we talked about:   For the burning in your thumbs, we will get labs today to ensure that it is not your calcium, magnesium or vitamin B12 levels causing this.  It was also suggested to get a screening TSH and free T4 level to check your thyroid as it can sometimes cause the symptoms.  I will release these results on MyChart or give you a call if anything is abnormal.  In the meantime, using Voltaren gel on the areas that are hurting on your wrist may help.  If the pain worsens or does not improve and the labs are all normal then we can discuss possible bracing or further imaging of your neck at that time.  Please follow-up with me in 3 months or sooner as needed.  I have sent your tetanus vaccine to your pharmacy.  If you have any questions or concerns, please do not hesitate to call the office at [phone].  Take Care,   Martinique Cooper Moroney, DO

## 2019-04-23 NOTE — Progress Notes (Signed)
   SUBJECTIVE:   CHIEF COMPLAINT / HPI:   Hyperparathyroidism  seizures: S/p parathyroidectomy on 1/22.patient previously with low magnesium that was repleted and her symptoms improved however she is still having paresthesias in her bilateral hands that she is describing as a burning sensation.  She reports that she believes it started prior to her surgery.  She states that she has been going to [employer] chiropractor who has been doing limited things on her neck given her history of osteoporosis, but states it has not helped very much.  She states that the burning will occur periodically throughout the day and she is able to rub her thumbs in order to improve it.  She denies any trauma to the area.  She denies any weakness. She denies any other symptoms and overall feels well.  Hypertension: - Medications: Norvasc 5 mg - Compliance: yes - Checking BP at home: no - Denies any SOB, CP, vision changes, LE edema, medication SEs, or symptoms of hypotension - Exercise: Patient is beginning to exercise regularly  Osteoporosis: Patient has started taking vitamin D and calcium.  States that her endocrinologist recommended that she follow-up with [place] in regards to her osteoporosis.  She states she has started doing exercises more regularly and is asking what type of exercises will be best in order to help build her bones.  PERTINENT  PMH / PSH: Hyperparathyroidism, HTN, osteoporosis  OBJECTIVE:  BP 124/62   Pulse 66   Ht 5\' 4"  (1.626 m)   Wt 190 lb 8 oz (86.4 kg)   SpO2 99%   BMI 32.70 kg/m   General: NAD, pleasant Neck: Supple, no LAD Respiratory:  normal work of breathing Gastrointestinal: soft, nontender, nondistended, normoactive BS Neuro: CN II-XII grossly intact Psych: AOx3, appropriate affect Left wrist/hand: No deformity noted.FROM with 5/5 strength digits and wrist. No TTP. NVI distally. Negative tinels, phalens. Right wrist/hand: No deformity noted. FROM with 5/5 strength digits and  wrist. No TTP. NVI distally. Negative tinels, phalens.  ASSESSMENT/PLAN:   Osteoporosis Patient to continue with her calcium and vitamin D supplementation.  Also encouraged to continue with weightbearing exercises.  We will send bone building workouts MyChart.  -Will also initiate Fosamax once weekly that she is to take at least 60 minutes prior to eating any meals and attempt to remain upright.  -If patient is unable to tolerate side effects of Fosamax then may consider doing once every 37-month injection such as Prolia  Essential hypertension BP at goal today 124/62.  Continue current management with Norvasc 5.1 g.  Paresthesia of hand, bilateral Unclear etiology.  Symptoms not consistent with carpal tunnel.  Pain mostly located over first MCP so bilateral OA not ruled out but also less common.  Likely that burning pain and paresthesias may be coming from cervical etiology.  Given that patient recently had hyperparathyroidism with symptoms similar that have slowly improved we will continue to monitor.  In the meantime patient encouraged to use Voltaren gel over the area.  If symptoms persist then may need to further work-up with imaging of C-spine.    Martinique Ave Scharnhorst, DO PGY-3, [facility]

## 2019-04-24 ENCOUNTER — Encounter: Payer: Self-pay | Admitting: Family Medicine

## 2019-04-24 LAB — BASIC METABOLIC PANEL
BUN/Creatinine Ratio: 22 (ref 12–28)
BUN: 15 mg/dL (ref 8–27)
CO2: 25 mmol/L (ref 20–29)
Calcium: 10.1 mg/dL (ref 8.7–10.3)
Chloride: 102 mmol/L (ref 96–106)
Creatinine, Ser: 0.69 mg/dL (ref 0.57–1.00)
GFR calc Af Amer: 103 mL/min/{1.73_m2} (ref >59)
GFR calc non Af Amer: 89 mL/min/{1.73_m2} (ref >59)
Glucose: 93 mg/dL (ref 65–99)
Potassium: 4.3 mmol/L (ref 3.5–5.2)
Sodium: 142 mmol/L (ref 134–144)

## 2019-04-24 LAB — TSH+FREE T4
Free T4: 1.24 ng/dL (ref 0.82–1.77)
TSH: 1.46 u[IU]/mL (ref 0.450–4.500)

## 2019-04-24 LAB — MAGNESIUM: Magnesium: 1.7 mg/dL (ref 1.6–2.3)

## 2019-04-24 LAB — VITAMIN B12: Vitamin B-12: 1273 pg/mL — ABNORMAL HIGH (ref 232–1245)

## 2019-04-26 ENCOUNTER — Encounter: Payer: Self-pay | Admitting: Family Medicine

## 2019-04-26 DIAGNOSIS — R202 Paresthesia of skin: Secondary | ICD-10-CM | POA: Insufficient documentation

## 2019-04-26 MED ORDER — ALENDRONATE SODIUM 70 MG PO TABS: 70.0000 mg | ORAL_TABLET | ORAL | 11 refills | Status: DC

## 2019-04-26 NOTE — Assessment & Plan Note (Signed)
Patient to continue with her calcium and vitamin D supplementation.  Also encouraged to continue with weightbearing exercises.  We will send bone building workouts MyChart.  -Will also initiate Fosamax once weekly that she is to take at least 60 minutes prior to eating any meals and attempt to remain upright.  -If patient is unable to tolerate side effects of Fosamax then may consider doing once every 48-month injection such as Prolia

## 2019-04-26 NOTE — Assessment & Plan Note (Signed)
BP at goal today 124/62.  Continue current management with Norvasc 5.1 g.

## 2019-04-26 NOTE — Assessment & Plan Note (Signed)
Unclear etiology.  Symptoms not consistent with carpal tunnel.  Pain mostly located over first MCP so bilateral OA not ruled out but also less common.  Likely that burning pain and paresthesias may be coming from cervical etiology.  Given that patient recently had hyperparathyroidism with symptoms similar that have slowly improved we will continue to monitor.  In the meantime patient encouraged to use Voltaren gel over the area.  If symptoms persist then may need to further work-up with imaging of C-spine.

## 2019-05-13 ENCOUNTER — Other Ambulatory Visit: Payer: Self-pay | Admitting: Family Medicine

## 2019-07-04 ENCOUNTER — Encounter: Payer: Self-pay | Admitting: Family Medicine

## 2019-08-17 ENCOUNTER — Other Ambulatory Visit: Payer: Self-pay | Admitting: Family Medicine

## 2019-08-28 ENCOUNTER — Ambulatory Visit (INDEPENDENT_AMBULATORY_CARE_PROVIDER_SITE_OTHER): Payer: PRIVATE HEALTH INSURANCE | Admitting: Family Medicine

## 2019-08-28 ENCOUNTER — Other Ambulatory Visit: Payer: Self-pay

## 2019-08-28 DIAGNOSIS — L989 Disorder of the skin and subcutaneous tissue, unspecified: Secondary | ICD-10-CM

## 2019-08-28 NOTE — Progress Notes (Signed)
    SUBJECTIVE:   CHIEF COMPLAINT / HPI: Skin concern on scalp  Patient states that she has had a lesion on her scalp for many years that is nonpruritic, does not bleed and has no draining of fluid.  Patient denies any personal history of melanoma.  Patient reports family history of psoriasis and this is the reason she is coming in for this visit today.  Patient states that this area is nontender and has not had any changes over the years but notes that it just feels "crusty" and she only notices when she washes her hair or brushes her hair.  She denies any other areas similar to this on her body.  PERTINENT  PMH / PSH: History of hypertension and hyperparathyroidism OBJECTIVE:   BP 124/76   Pulse 70   Ht 5' 4.75" (1.645 m)   Wt 184 lb 9.6 oz (83.7 kg)   SpO2 97%   BMI 30.96 kg/m   General: Female appearing stated age in no acute distress Cardio: Normal S1 and S2, no S3 or S4. Rhythm is regular. No murmurs or rubs.  Bilateral radial pulses palpable Pulm: Clear to auscultation bilaterally, no crackles, wheezing, or diminished breath sounds. Normal respiratory effort, stable on room air Abdomen: Bowel sounds normal. Abdomen soft and non-tender. Skin: See images below Media Information   Document Information  ASSESSMENT/PLAN:   Lesion of skin of scalp Patient presents with 1.5 cm scaly, pale yellow scalp lesion that has been present for years.  No bleeding, no drainage of fluids, and no changes to his size or shape over the years.  Lesions also nontender.  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  Also with suspicious for melanoma as it is raised and lighter in color than would be expected with melanoma presentation. - will monitor  - patient couseled on check in appearance regularly and notify PCP if any changes observed, patient verbalized  understanding -patient scheduled to meet new PCP to discuss hyperparathyroidism, patient reports following with endocrinology, previous calcium levels within normal limits   Eulis Foster, MD [place]

## 2019-08-28 NOTE — Patient Instructions (Addendum)
  He was a pleasure meeting you today.  I believe the spot on your back is a seborrheic keratosis and if this becomes bothersome for you, we can schedule you on a dermatology clinic or have you treated with cryotherapy.  Please continue to monitor the spot in your scalp, please notify us if there are any changes, begins to bleed or has changing appearance.  Please follow-up on any skin changes that she noticed with your appointment with your new primary care doctor on August 5.  Best wishes, Dr. Alba Cory  Seborrheic Keratosis A seborrheic keratosis is a common, noncancerous (benign) skin growth. These growths are velvety, waxy, rough, tan, brown, or black spots that appear on the skin. These skin growths can be flat or raised, and scaly. What are the causes? The cause of this condition is not known. What increases the risk? You are more likely to develop this condition if you:  Have a family history of seborrheic keratosis.  Are 50 or older.  Are pregnant.  Have had estrogen replacement therapy. What are the signs or symptoms? Symptoms of this condition include growths on the face, chest, shoulders, back, or other areas. These growths:  Are usually painless, but may become irritated and itchy.  Can be yellow, brown, black, or other colors.  Are slightly raised or have a flat surface.  Are sometimes rough or wart-like in texture.  Are often velvety or waxy on the surface.  Are round or oval-shaped.  Often occur in groups, but may occur as a single growth. How is this diagnosed? This condition is diagnosed with a medical history and physical exam.  A sample of the growth may be tested (skin biopsy).  You may need to see a skin specialist (dermatologist). How is this treated? Treatment is not usually needed for this condition, unless the growths are irritated or bleed often.  You may also choose to have the growths removed if you do not like their  appearance. ? Most commonly, these growths are treated with a procedure in which liquid nitrogen is applied to "freeze" off the growth (cryosurgery). ? They may also be burned off with electricity (electrocautery) or removed by scraping (curettage). Follow these instructions at home:  Watch your growth for any changes.  Keep all follow-up visits as told by your health care provider. This is important.  Do not scratch or pick at the growth or growths. This can cause them to become irritated or infected. Contact a health care provider if:  You suddenly have many new growths.  Your growth bleeds, itches, or hurts.  Your growth suddenly becomes larger or changes color. Summary  A seborrheic keratosis is a common, noncancerous (benign) skin growth.  Treatment is not usually needed for this condition, unless the growths are irritated or bleed often.  Watch your growth for any changes.  Contact a health care provider if you suddenly have many new growths or your growth suddenly becomes larger or changes color.  Keep all follow-up visits as told by your health care provider. This is important. This information is not intended to replace advice given to you by your health care provider. Make sure you discuss any questions you have with your health care provider. Document Revised: 06/02/2017 Document Reviewed: 06/02/2017 Elsevier Patient Education  [place].

## 2019-08-29 DIAGNOSIS — L989 Disorder of the skin and subcutaneous tissue, unspecified: Secondary | ICD-10-CM | POA: Insufficient documentation

## 2019-08-29 NOTE — Assessment & Plan Note (Signed)
Patient presents with 1.5 cm scaly, pale yellow scalp lesion that has been present for years.  No bleeding, no drainage of fluids, and no changes to his size or shape over the years.  Lesions also nontender.  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  Also with suspicious for melanoma as it is raised and lighter in color than would be expected with melanoma presentation. - will monitor  - patient couseled on check in appearance regularly and notify PCP if any changes observed, patient verbalized understanding -patient scheduled to meet new PCP to discuss hyperparathyroidism, patient reports following with endocrinology, previous calcium levels within normal limits

## 2019-08-31 ENCOUNTER — Encounter: Payer: Self-pay | Admitting: Family Medicine

## 2019-08-31 ENCOUNTER — Ambulatory Visit (INDEPENDENT_AMBULATORY_CARE_PROVIDER_SITE_OTHER): Payer: Medicare Other | Admitting: Family Medicine

## 2019-08-31 ENCOUNTER — Other Ambulatory Visit: Payer: Self-pay

## 2019-08-31 VITALS — BP 142/75 | HR 63 | Ht 64.0 in | Wt 184.0 lb

## 2019-08-31 DIAGNOSIS — Z23 Encounter for immunization: Secondary | ICD-10-CM

## 2019-08-31 NOTE — Progress Notes (Signed)
Patient presents for COVID-19 vaccination.   Patient was recently seen on 7/27 and states that she does not need to speak with a provider today. Offered to answer any questions concerning COVID-19 vaccination, patient states that she does not have any questions. Reviewed signs and symptoms of anaphylaxis and re-emphasized the purpose of monitoring the patient for 15 minutes following vaccine administration.   Check on patient after 15 minutes, patient denies any SOB, sore throat, pain, dizziness,pruritis, hearing changes or vision changes.

## 2019-09-20 ENCOUNTER — Ambulatory Visit (INDEPENDENT_AMBULATORY_CARE_PROVIDER_SITE_OTHER): Payer: Medicare Other | Admitting: Family Medicine

## 2019-09-20 ENCOUNTER — Other Ambulatory Visit: Payer: Self-pay

## 2019-09-20 ENCOUNTER — Encounter: Payer: Self-pay | Admitting: Family Medicine

## 2019-09-20 VITALS — BP 152/82 | HR 64 | Wt 187.4 lb

## 2019-09-20 DIAGNOSIS — I10 Essential (primary) hypertension: Secondary | ICD-10-CM

## 2019-09-20 DIAGNOSIS — E210 Primary hyperparathyroidism: Secondary | ICD-10-CM

## 2019-09-20 DIAGNOSIS — M818 Other osteoporosis without current pathological fracture: Secondary | ICD-10-CM | POA: Diagnosis not present

## 2019-09-20 NOTE — Patient Instructions (Signed)
Thank you for coming to see me today. It was a pleasure. Today we talked about:   It was great to meet you!  OsteoStrong  Please follow-up with me in 6 months.  If you have any questions or concerns, please do not hesitate to call the office at [phone].  Best,   [place] Constable, DO

## 2019-09-20 NOTE — Progress Notes (Signed)
    SUBJECTIVE:   CHIEF COMPLAINT / HPI:   Meet New PCP Patient presents today to meet new PCP States that she does not have any complaints today, just wanted to catch up new PCP on what has been going on and feel comfortable if a problem arises in the future  Hyperparathyroidism Patient had parathyroidectomy on 02/23/2019 Patient seen in ED 2 days afterwards for paresthesias in bilateral hands with burning sensations, magnesium was low and was repleted Has been doing well  Follows with Dr. Harlow Asa and has upcoming appointment  Osteoporosis Patient was started on Fosamax on 04/23/2019 Continues to take calcium and vitamin D  She is tolerating this well She has looked into exercise programs that can help with osteoporosis and is considering them  Hypertension Current regimen: Norvasc 5 mg daily Patient reports compliance and never misses a dose Patient does report some whitecoat hypertension States that she does not check her blood pressure at home  PERTINENT  PMH / PSH: HTN, hyperparathyroidism, osteoporosis  OBJECTIVE:   BP (!) 152/82   Pulse 64   Wt 187 lb 6.4 oz (85 kg)   SpO2 97%   BMI 32.17 kg/m    BP recheck: 136/78  Physical Exam:  General: 69 y.o. female in NAD Lungs: Breathing comfortably on room air Skin: warm and dry Extremities: No edema, ambulating without difficulty   ASSESSMENT/PLAN:   Hyperparathyroidism, primary ([place]) Following with Dr. Harlow Asa, doing well.  S/p parathyroid removal.  Osteoporosis Continue Fosamax, calcium and vitamin D supplementation.  Discussed that [facility] is a program that is available, which she can look into.  Essential hypertension BP on recheck improved to 136/78.  Patient reports whitecoat hypertension.  Continue with Norvasc 5 mg.  Will follow-up at next visit.   HM: UTD  Cleophas Dunker, [place]

## 2019-09-21 ENCOUNTER — Other Ambulatory Visit: Payer: Self-pay

## 2019-09-21 ENCOUNTER — Ambulatory Visit (INDEPENDENT_AMBULATORY_CARE_PROVIDER_SITE_OTHER): Payer: Medicare Other | Admitting: *Deleted

## 2019-09-21 DIAGNOSIS — Z23 Encounter for immunization: Secondary | ICD-10-CM | POA: Diagnosis present

## 2019-09-21 NOTE — Assessment & Plan Note (Signed)
Continue Fosamax, calcium and vitamin D supplementation.  Discussed that [facility] Faster is a program that is available, which she can look into.

## 2019-09-21 NOTE — Assessment & Plan Note (Signed)
BP on recheck improved to 136/78.  Patient reports whitecoat hypertension.  Continue with Norvasc 5 mg.  Will follow-up at next visit.

## 2019-09-21 NOTE — Assessment & Plan Note (Signed)
Following with Dr. Harlow Asa, doing well.  S/p parathyroid removal.

## 2020-02-28 ENCOUNTER — Other Ambulatory Visit: Payer: Self-pay | Admitting: Surgery

## 2020-02-28 DIAGNOSIS — E041 Nontoxic single thyroid nodule: Secondary | ICD-10-CM

## 2020-02-29 ENCOUNTER — Ambulatory Visit
Admission: RE | Admit: 2020-02-29 | Discharge: 2020-02-29 | Disposition: A | Discharge status: Home / Self Care | Payer: Medicare Other | Source: Ambulatory Visit | Attending: Surgery | Admitting: Surgery

## 2020-02-29 DIAGNOSIS — E041 Nontoxic single thyroid nodule: Secondary | ICD-10-CM

## 2020-03-03 ENCOUNTER — Other Ambulatory Visit: Payer: Self-pay | Admitting: Family Medicine

## 2020-03-11 ENCOUNTER — Other Ambulatory Visit: Payer: Self-pay

## 2020-03-11 MED ORDER — ALENDRONATE SODIUM 70 MG PO TABS: 70.0000 mg | ORAL_TABLET | ORAL | 11 refills | Status: DC

## 2020-03-19 ENCOUNTER — Other Ambulatory Visit: Payer: Self-pay

## 2020-03-19 ENCOUNTER — Ambulatory Visit (INDEPENDENT_AMBULATORY_CARE_PROVIDER_SITE_OTHER): Payer: Medicare Other | Admitting: Family Medicine

## 2020-03-19 ENCOUNTER — Encounter: Payer: Self-pay | Admitting: Family Medicine

## 2020-03-19 VITALS — BP 136/74 | HR 58 | Ht 64.25 in | Wt 191.0 lb

## 2020-03-19 DIAGNOSIS — Z0000 Encounter for general adult medical examination without abnormal findings: Secondary | ICD-10-CM | POA: Diagnosis not present

## 2020-03-19 DIAGNOSIS — I10 Essential (primary) hypertension: Secondary | ICD-10-CM

## 2020-03-19 DIAGNOSIS — E210 Primary hyperparathyroidism: Secondary | ICD-10-CM

## 2020-03-19 DIAGNOSIS — M818 Other osteoporosis without current pathological fracture: Secondary | ICD-10-CM

## 2020-03-19 MED ORDER — AMLODIPINE BESYLATE 5 MG PO TABS: 5.0000 mg | ORAL_TABLET | Freq: Every day | ORAL | 3 refills | Status: DC

## 2020-03-19 NOTE — Assessment & Plan Note (Signed)
Overall she is doing very well.  Did discuss getting a lipid panel today and other blood work, but she would like to hold off on this as they will likely change providers.  She is up-to-date on her health maintenance.  Did encourage her to obtain an Covid booster and she and her husband will decide on this.  She would be due for pneumonia vaccine, 15 or 20.  Discussed with her that we do not have this at this office, but if she would like to obtain this, could see if they have it at the pharmacy or discussed with her future physician.  She does not have any other risk factors, therefore this would not be of significant need.

## 2020-03-19 NOTE — Patient Instructions (Signed)
Thank you for coming to see me today. It was a pleasure. Today we talked about:   Come back for labs in 6 months.  You will need the PCV15 or 20 vaccination, but we don't have that here.  Please follow-up with PCP in 6 months.  If you have any questions or concerns, please do not hesitate to call the office at [phone].  Best,   Arizona Constable, DO

## 2020-03-19 NOTE — Assessment & Plan Note (Signed)
Next bone mineral density should be in 2023.  Currently on Fosamax, calcium, vitamin D and doing well.  Continue with current regimen.  Also discussed continuing weightbearing exercises and overall good health.

## 2020-03-19 NOTE — Assessment & Plan Note (Signed)
BP is okay today.  Patient has a history of whitecoat hypertension.  No concerns today.  Continue with current regimen.  She would like to wait for lab work as she will likely be changing providers.

## 2020-03-19 NOTE — Progress Notes (Signed)
    SUBJECTIVE:   CHIEF COMPLAINT / HPI:   Hx Parathyroidectomy Seen by surgery and no further f/u needed Requested TSH by PCP next year, drawn 3 days ago, but was told it was normal No further [place] needed  Hypertension Current regimen: Norvasc 5 mg daily Takes this daily without problem Last BMP 04/23/2019, WNL  Annual health maintenance Last lipid panel 2 years ago, LDL 103 at that time COVID booster due 03/23/2020 Overall doing well, very active  Osteoporosis Started on Fosamax 04/23/2019 Taking calcium and vitamin D Last bone mineral density on 02/13/2019   PERTINENT  PMH / PSH: HTN, hyperparathyroidism s/p parathyroidectomy, osteoporosis  OBJECTIVE:   BP 136/74   Pulse (!) 58   Ht 5' 4.25" (1.632 m)   Wt 191 lb (86.6 kg)   SpO2 99%   BMI 32.53 kg/m    Physical Exam:  General: 70 y.o. female in NAD HEENT: NCAT Neck: Supple, no thyromegaly, no cervical lymphadenopathy Cardio: RRR no m/r/g Lungs: CTAB, no wheezing, no rhonchi, no crackles, no IWOB on RA Abdomen: Soft, non-tender to palpation, non-distended, positive bowel sounds Skin: warm and dry Extremities: No edema   ASSESSMENT/PLAN:   Essential hypertension BP is okay today.  Patient has a history of whitecoat hypertension.  No concerns today.  Continue with current regimen.  She would like to wait for lab work as she will likely be changing providers.  Hyperparathyroidism, primary ([place]) S/p parathyroidectomy.  Per her surgeon's notes, no need for further ultrasounds.  Patient is to continue to monitor for signs of any lumps or changes in her neck.  She had a TSH drawn this week, will not repeat.  She will likely need a repeat in 1 year.  Osteoporosis Next bone mineral density should be in 2023.  Currently on Fosamax, calcium, vitamin D and doing well.  Continue with current regimen.  Also discussed continuing weightbearing exercises and overall good health.  Encounter for annual physical exam Overall  she is doing very well.  Did discuss getting a lipid panel today and other blood work, but she would like to hold off on this as they will likely change providers.  She is up-to-date on her health maintenance.  Did encourage her to obtain an Covid booster and she and her husband will decide on this.  She would be due for pneumonia vaccine, 15 or 20.  Discussed with her that we do not have this at this office, but if she would like to obtain this, could see if they have it at the pharmacy or discussed with her future physician.  She does not have any other risk factors, therefore this would not be of significant need.    Cleophas Dunker, [place]

## 2020-03-19 NOTE — Assessment & Plan Note (Signed)
S/p parathyroidectomy.  Per her surgeon's notes, no need for further ultrasounds.  Patient is to continue to monitor for signs of any lumps or changes in her neck.  She had a TSH drawn this week, will not repeat.  She will likely need a repeat in 1 year.

## 2020-04-07 ENCOUNTER — Encounter: Payer: Self-pay | Admitting: Family Medicine

## 2020-04-09 NOTE — Progress Notes (Signed)
    SUBJECTIVE:   CHIEF COMPLAINT / HPI:   Left Heel Pain Worsening progressively since last appointment on 2/16 Three days ago, woke up with severe heel pain with first step out of bed Sent a MyChart message asking for recommendations and advised her to perform rehab exercises and ice with water bottle She has been performing and has had significant improvement in her symptoms Also using arch support  Left cheek skin lesion Has had a spot on the right side of her cheek for many years Reports that she thinks over time it has slowly enlarged Has not had any changes in color Denies any bleeding Just wants to know if this is okay  PERTINENT  PMH / PSH: HTN, primary hyperparathyroidism s/p removal, osteoporosis  OBJECTIVE:   BP (!) 156/69   Pulse (!) 58   Ht 5\' 5"  (1.651 m)   Wt 194 lb (88 kg)   SpO2 97%   BMI 32.28 kg/m    Physical Exam:  General: 70 y.o. female in NAD Skin: 1.5 cm circular hyperpigmented patch on right cheek near nose without overlying excoriation, appears uniform in color, clear borders  Limited Left Foot: Inspection:  No obvious bony deformity.  No swelling, erythema, or bruising.  Normal arch Palpation: No tenderness to palpation specifically along calcaneus, achilles tendon, plantar fascia ROM: Full  ROM of the ankle. Neurovascular: N/V intact distally in the lower extremity   ASSESSMENT/PLAN:   Plantar fasciitis of left foot Symptoms significantly improved with rehab exercises.  Advised for her to continue this.  Advised that if she has worsening of her symptoms that she can return to care.  Could consider formal physical therapy in the future, but she is doing very well with exercises on her own.  Continue using arch supports as well.  Solar lentigo Discussed with patient that this does not appear to be concerning for malignancy but the only way to completely rule out would be with a biopsy.  She is okay with this.  Discussed that she should take  a picture on her phone to remember what it looks like and continue to monitor it.  If she notices significant change, she should return to care.     Cleophas Dunker, [place]

## 2020-04-10 ENCOUNTER — Other Ambulatory Visit: Payer: Self-pay

## 2020-04-10 ENCOUNTER — Ambulatory Visit (INDEPENDENT_AMBULATORY_CARE_PROVIDER_SITE_OTHER): Payer: Medicare Other | Admitting: Family Medicine

## 2020-04-10 VITALS — BP 156/69 | HR 58 | Ht 65.0 in | Wt 194.0 lb

## 2020-04-10 DIAGNOSIS — L814 Other melanin hyperpigmentation: Secondary | ICD-10-CM

## 2020-04-10 DIAGNOSIS — M722 Plantar fascial fibromatosis: Secondary | ICD-10-CM | POA: Diagnosis present

## 2020-04-10 NOTE — Assessment & Plan Note (Signed)
Symptoms significantly improved with rehab exercises.  Advised for her to continue this.  Advised that if she has worsening of her symptoms that she can return to care.  Could consider formal physical therapy in the future, but she is doing very well with exercises on her own.  Continue using arch supports as well.

## 2020-04-10 NOTE — Patient Instructions (Signed)
Thank you for coming to see me today. It was a pleasure. Today we talked about:   Continue your exercises.  Keep an eye on the place on your face and let me know if it changes.  If you have any questions or concerns, please do not hesitate to call the office at [phone].  Best,   Arizona Constable, DO

## 2020-04-10 NOTE — Assessment & Plan Note (Signed)
Discussed with patient that this does not appear to be concerning for malignancy but the only way to completely rule out would be with a biopsy.  She is okay with this.  Discussed that she should take a picture on her phone to remember what it looks like and continue to monitor it.  If she notices significant change, she should return to care.

## 2020-05-04 IMAGING — US US FNA BIOPSY THYROID 1ST LESION
1 series · 12 of 12 positions shown · non-contrast
Comparison: US Thyroid 11/24/18

MEDICATIONS:
5 cc 1% lidocaine

COMPLICATIONS:
None immediate.

INDICATION: Indeterminate thyroid nodule

Right mid lobe thyroid nodule
1.7 cm
EXAM:
ULTRASOUND GUIDED FINE NEEDLE ASPIRATION OF INDETERMINATE THYROID
NODULE
TECHNIQUE: Informed written consent was obtained from the patient after a
discussion of the risks, benefits and alternatives to treatment.
Questions regarding the procedure were encouraged and answered. A
timeout was performed prior to the initiation of the procedure.

[Series 1: us fna biopsy thyroid 1st lesion · 0.04mm/px · 12 acquisitions, 12 frames shown]
[im 1/12]
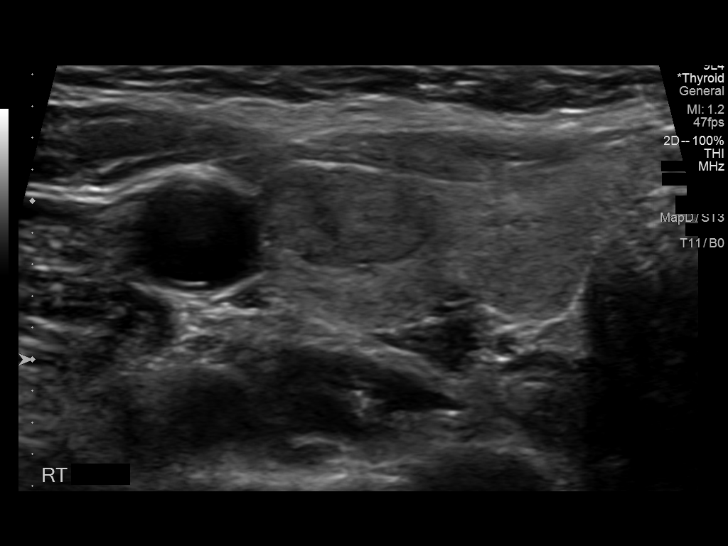
[im 2/12]
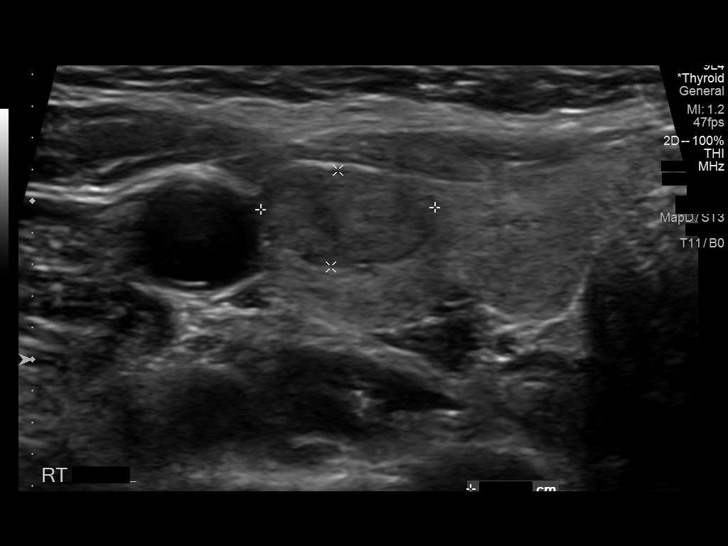
[im 3/12]
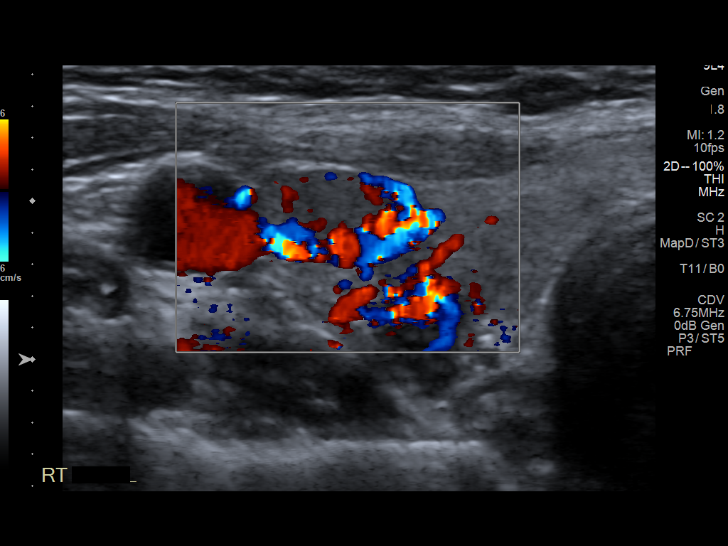
[im 4/12]
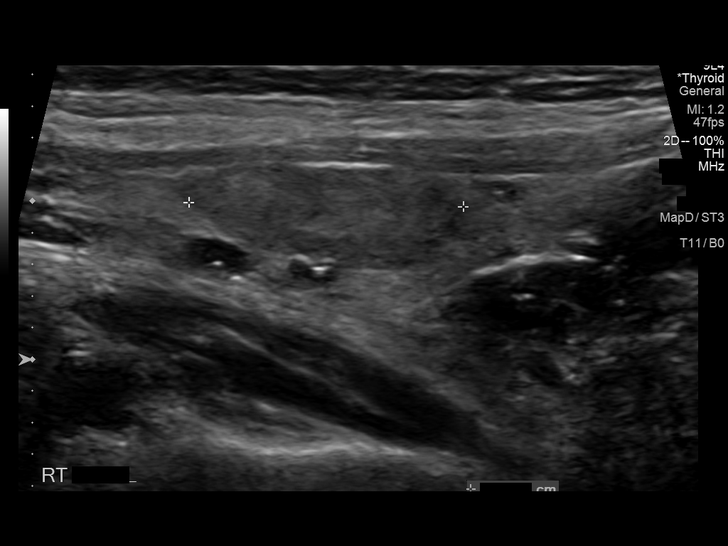
[im 5/12]
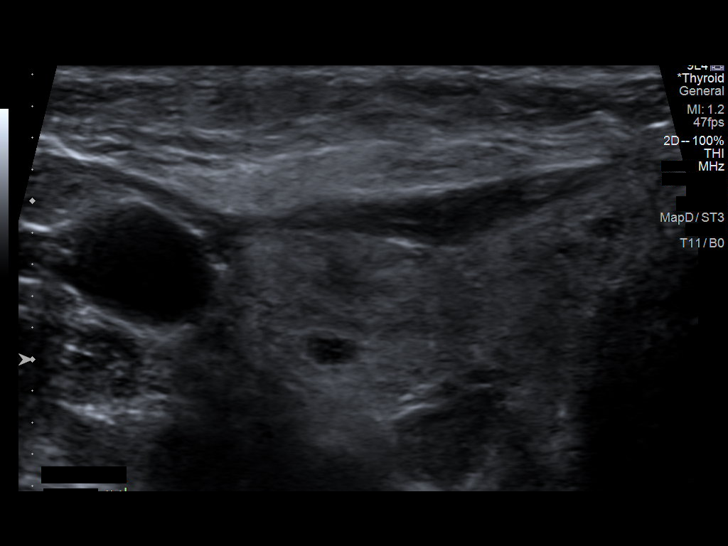
[im 6/12]
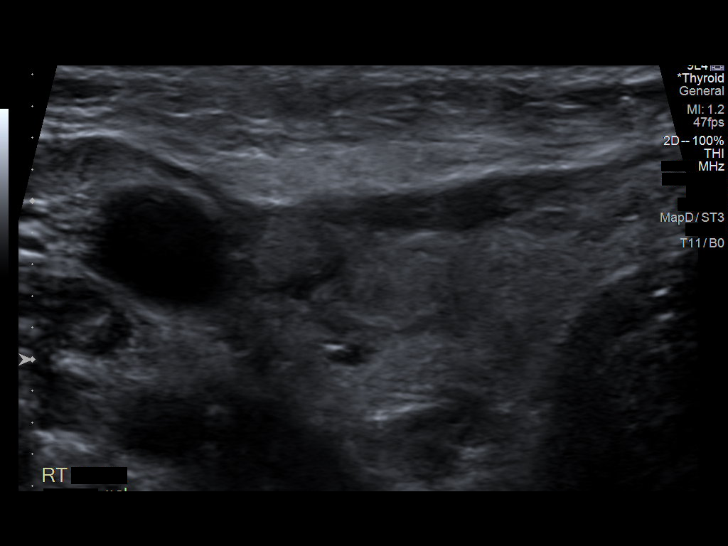
[im 7/12]
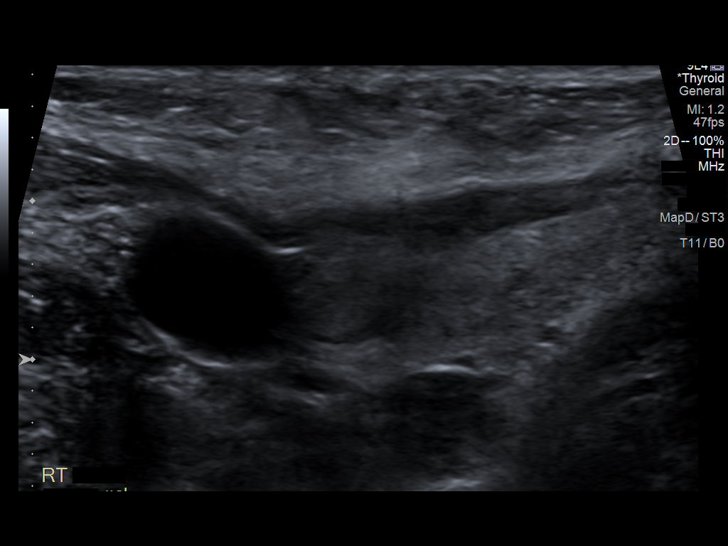
[im 8/12]
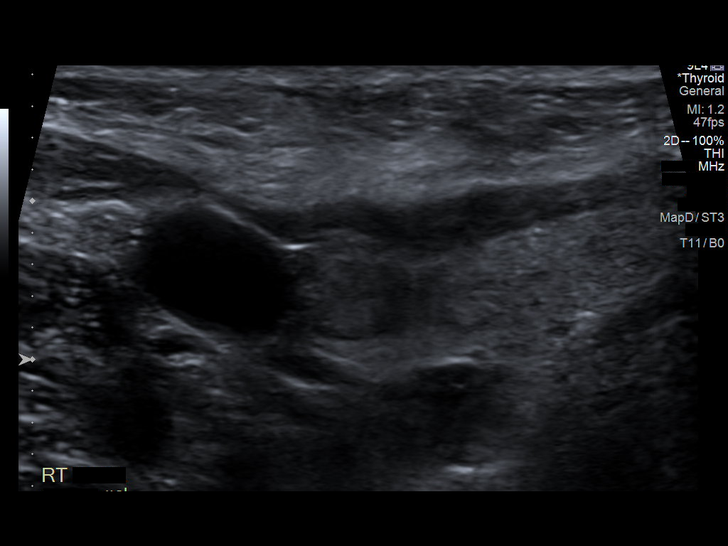
[im 9/12]
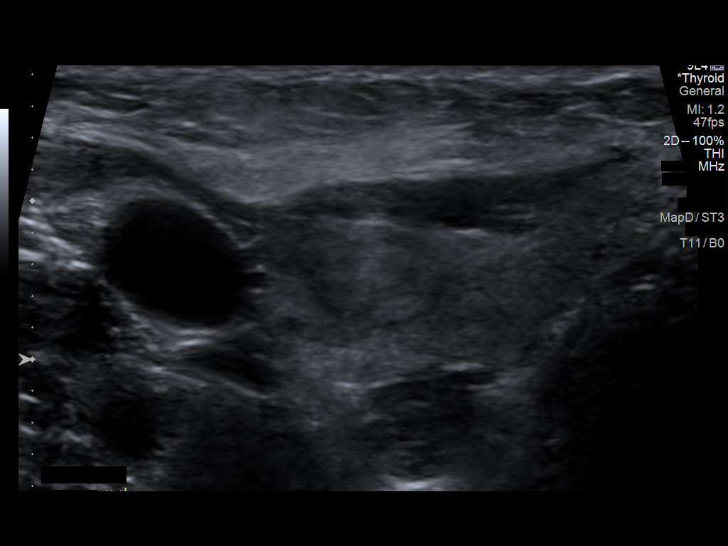
[im 10/12]
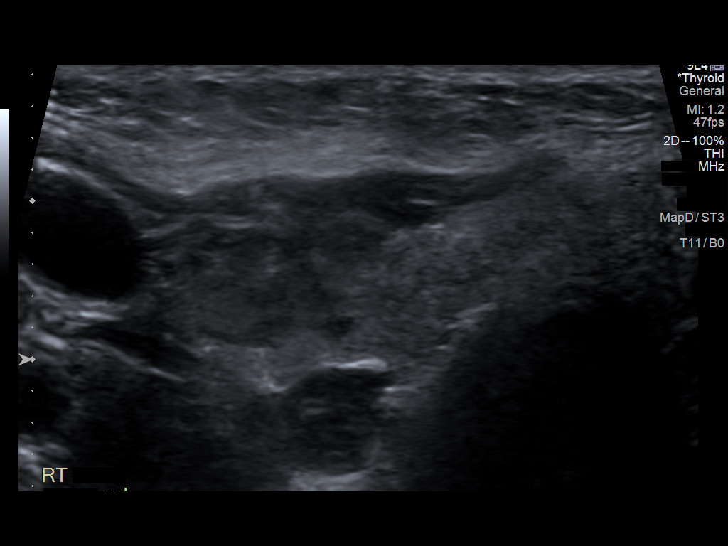
[im 11/12]
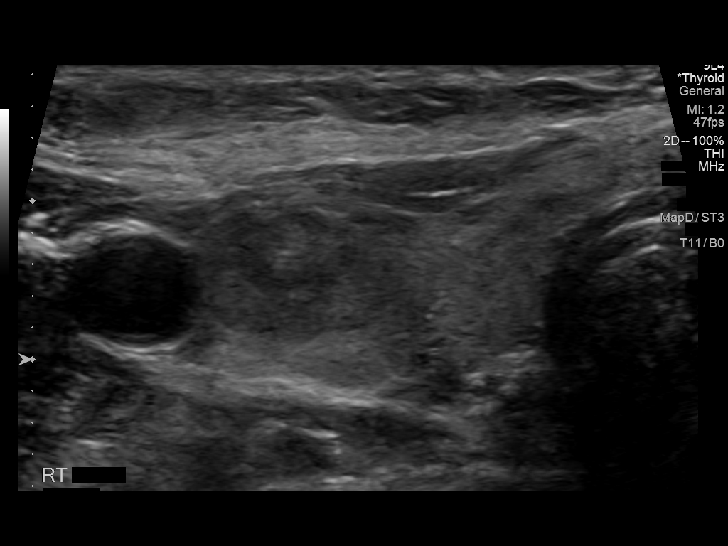
[im 12/12]
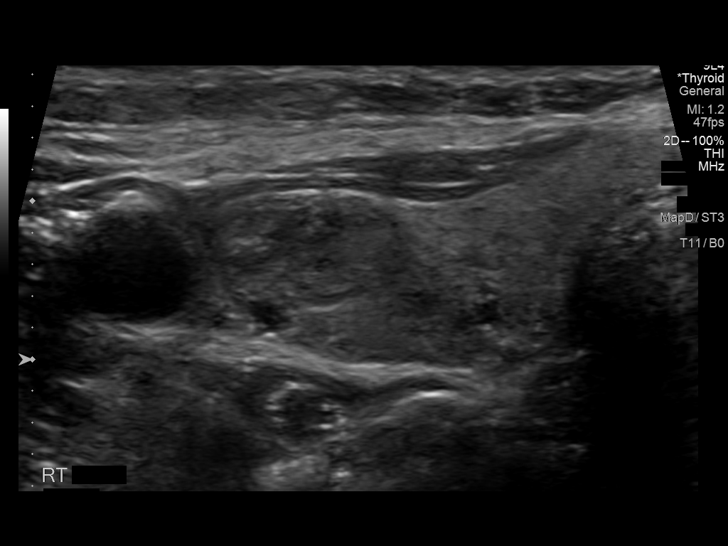

[12 of 12 positions shown; findings below may reference images not displayed]

Pre-procedural ultrasound scanning demonstrated unchanged size and
appearance of the indeterminate nodule within the right thyroid

The procedure was planned. The neck was prepped in the usual sterile
fashion, and a sterile drape was applied covering the operative
field. A timeout was performed prior to the initiation of the
procedure. Local anesthesia was provided with 1% lidocaine.

Under direct ultrasound guidance, 5 FNA biopsies were performed of
the right mid lobe thyroid nodule with a 25 gauge needle.

2 samples were obtained for AFIRMA as per ordering LORA

Multiple ultrasound images were saved for procedural documentation
purposes. The samples were prepared and submitted to pathology.

Limited post procedural scanning was negative for hematoma or
additional complication. Dressings were placed. The patient
tolerated the above procedures procedure well without immediate
postprocedural complication.
FINDINGS: Nodule reference number based on prior diagnostic ultrasound: 3

Maximum size: 1.7 cm

Location: Right; Mid

ACR TI-RADS risk category: TR4 (4-6 points)

Reason for biopsy: meets ACR TI-RADS criteria

Ultrasound imaging confirms appropriate placement of the needles
within the thyroid nodule.
IMPRESSION: Technically successful ultrasound guided fine needle aspiration of
right mid lobe thyroid nodule

Read by

Gucci Santoyo

## 2020-06-02 IMAGING — CT CT NECK SOFT TISSUE WO/W CM
3 of 10 series · 9 of 33 positions shown, 10 images · IV contrast (iopamidol)
Comparison: Thyroid ultrasound 12/20/2018, nuclear medicine
parathyroid scan 11/24/2018, thyroid ultrasound 11/24/2018

CLINICAL DATA: Primary hyperparathyroidism. Additional history
provided: Previous thyroid biopsy, history of renal stones.

EXAM:
CT NECK WITH AND WITHOUT CONTRAST
TECHNIQUE: Multidetector CT imaging of the neck was performed without and with
intravenous contrast. A 4D neck CT protocol for the detection
localization of parathyroid adenoma was utilized.
CONTRAST:  75mL KVD756-RRR IOPAMIDOL (KVD756-RRR) INJECTION 61%

[Series 8: neck arterial 2.00 br40 s3 · coronal · arterial · 0.37mm/px · 1 of 122 slices shown (1 of 2)]
[im 61/122  bone]
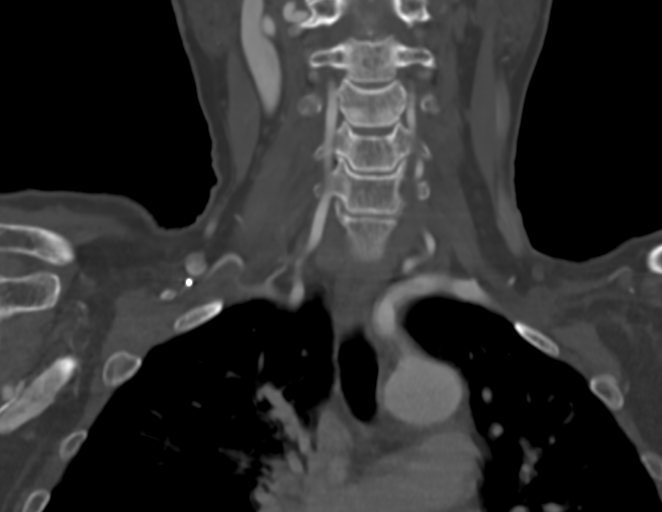

[Series 10: neck arterial 2.00 br40 s3 · sagittal · arterial · 0.37mm/px · 5 of 123 slices shown (2 of 2)]
[im 21/123  bone]
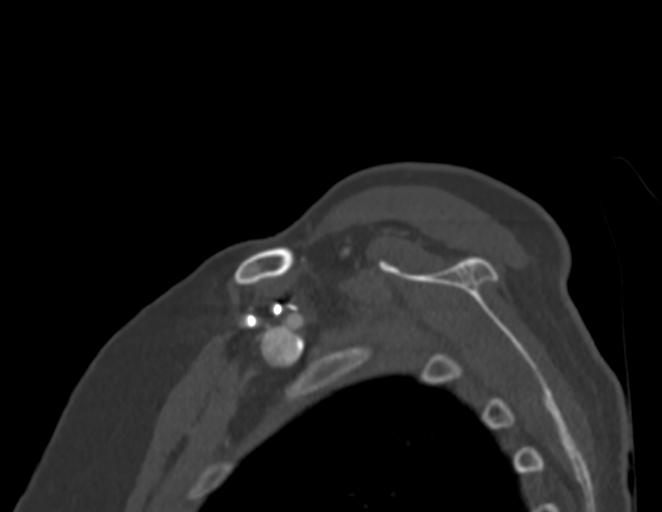
[im 41/123  bone]
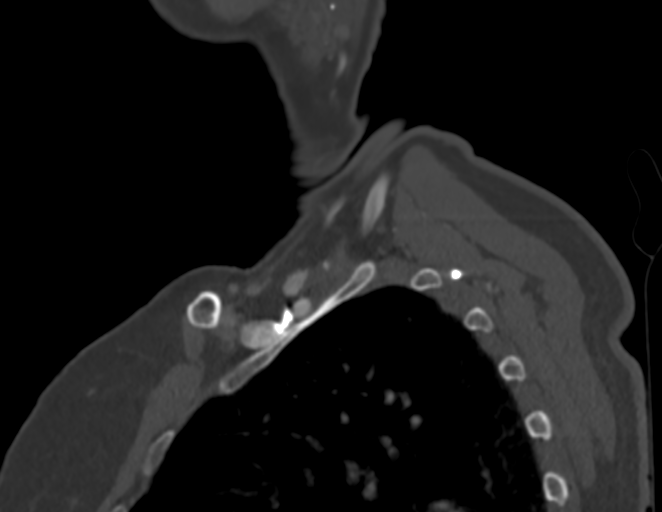
[im 62/123  bone]
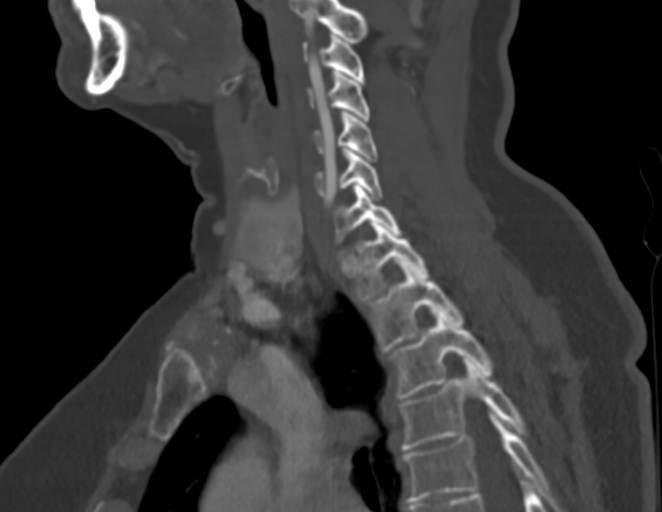
[im 82/123  bone]
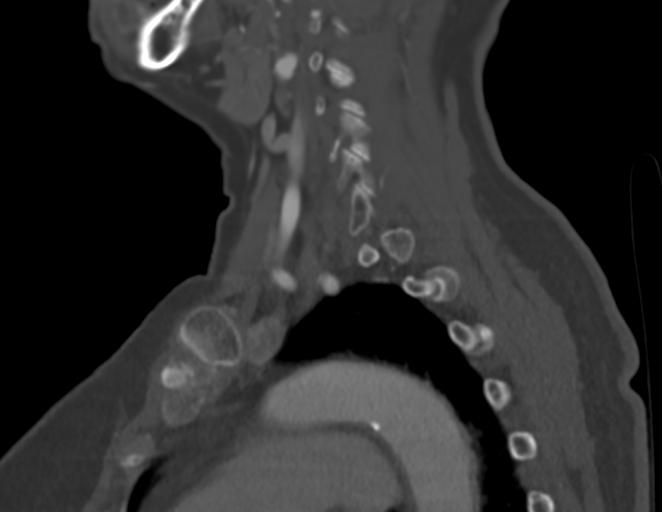
[im 102/123  bone]
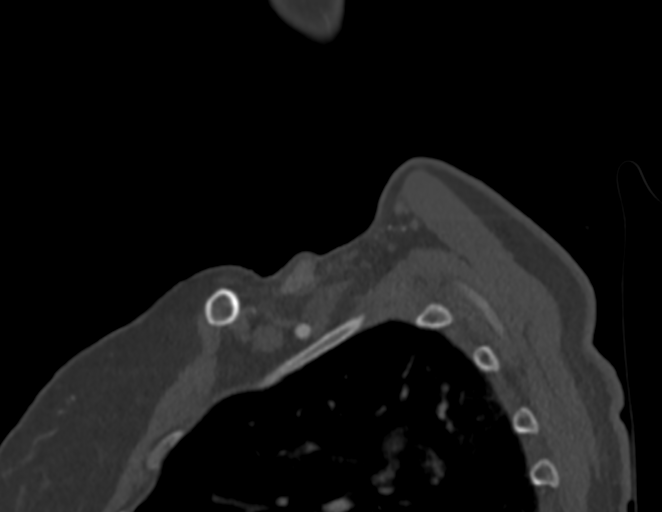

[Series 12: neck arterial 1.00 br40 s3 · axial · arterial · 0.48mm/px · z∈[-808,-619]mm · 3 of 190 slices shown, 4 images]
[im 1/190  soft-tissue]
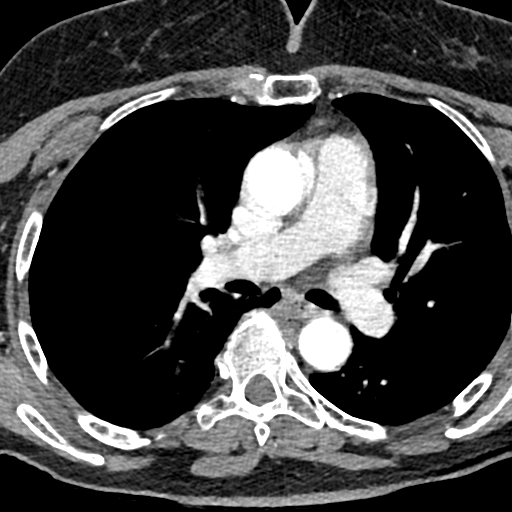
[im 1/190  bone]
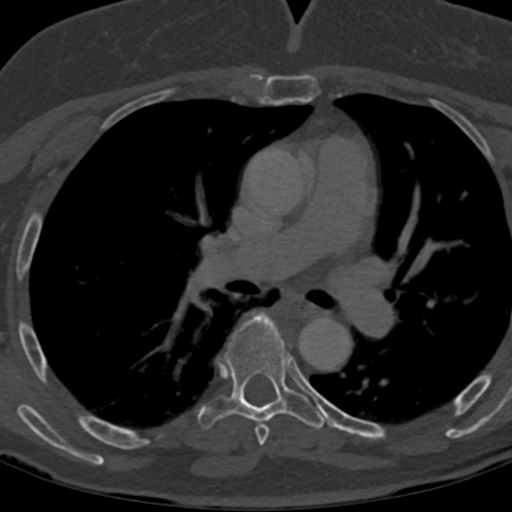
[im 95/190  bone]
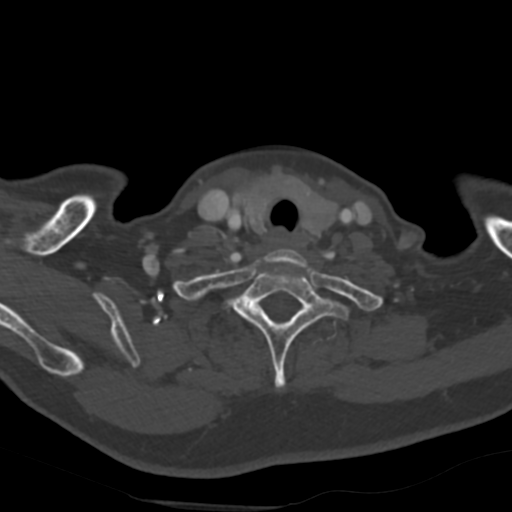
[im 190/190  bone]
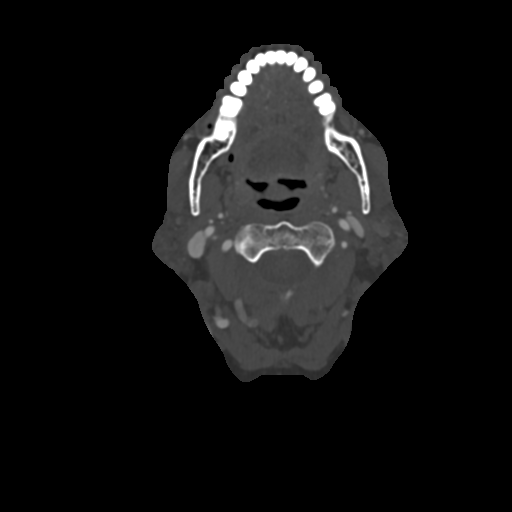

[9 of 33 positions shown; findings below may reference images not displayed]

FINDINGS: Pharynx and larynx: No appreciable mass or swelling within the
visualized pharynx or larynx

Salivary glands: Visualized submandibular glands unremarkable.

Thyroid: Redemonstrated 17 mm nodule within the mid right thyroid
lobe, which demonstrates arterial enhancement. Additional
subcentimeter nodules within the left thyroid lobe, not meeting
consensus criteria for ultrasound follow-up.

A candidate lesion is identified posterior to the inferior aspect of
the right thyroid lobe. The candidate lesion measures 12 mm,
demonstrates hypodensity relative to the thyroid tissue on
noncontrast imaging (series 5, image 70), arterial hyperenhancement
relative to thyroid tissue on arterial phase imaging (with central
nonenhancement)(series 12, image 94), and subtle hypoenhancement
relative to thyroid tissue on the venous delay (series 13, image
48). Additionally, a polar vessel is questioned (series 12, image
95). No other candidate lesion for parathyroid adenoma is
identified.

Lymph nodes: No pathologically enlarged cervical chain lymph nodes
within the neck.

Vascular: Visualized major vascular structures of the neck appear
patent.

Limited intracranial: Excluded from the field of view.

Visualized orbits: Excluded from the field of view.

Mastoids and visualized paranasal sinuses: Excluded from the field
of view

Skeleton: No acute bony abnormality. Cervical spondylosis with
multilevel posterior disc osteophytes as well as uncovertebral
hypertrophy. No high-grade bony spinal canal stenosis

Upper chest: No consolidation within the imaged lung apices.
IMPRESSION: 12 mm high probability candidate lesion for parathyroid adenoma
identified posterior to the lower right thyroid lobe. The lesion is
hypodense relative to thyroid tissue on noncontrast imaging,
arterially hyperenhancing, and subtly hypoenhancing on venous
delays. Additionally, a polar vessel is questioned.

No other candidate lesion is identified.

17 mm arterially enhancing right thyroid lobe nodule. This nodule
was reportedly biopsied on 12/20/2018. Please correlate with
available pathology.

## 2020-06-11 ENCOUNTER — Other Ambulatory Visit: Payer: Self-pay | Admitting: Family Medicine

## 2020-06-11 ENCOUNTER — Other Ambulatory Visit: Payer: Self-pay

## 2020-06-11 ENCOUNTER — Ambulatory Visit
Admission: RE | Admit: 2020-06-11 | Discharge: 2020-06-11 | Disposition: A | Discharge status: Home / Self Care | Payer: Medicare Other | Source: Ambulatory Visit | Attending: Family Medicine | Admitting: Family Medicine

## 2020-06-11 DIAGNOSIS — Z1231 Encounter for screening mammogram for malignant neoplasm of breast: Secondary | ICD-10-CM

## 2020-06-12 ENCOUNTER — Encounter: Payer: Self-pay | Admitting: Family Medicine

## 2020-07-10 ENCOUNTER — Encounter: Payer: Self-pay | Admitting: Family Medicine

## 2020-07-11 NOTE — Telephone Encounter (Signed)
Scheduled patient with Dr. Ouida Sills on 6/14 at 9:30.  Talbot Grumbling, RN

## 2020-07-15 ENCOUNTER — Ambulatory Visit (INDEPENDENT_AMBULATORY_CARE_PROVIDER_SITE_OTHER): Payer: Medicare Other | Admitting: Student in an Organized Health Care Education/Training Program

## 2020-07-15 ENCOUNTER — Other Ambulatory Visit: Payer: Self-pay

## 2020-07-15 ENCOUNTER — Encounter: Payer: Self-pay | Admitting: Student in an Organized Health Care Education/Training Program

## 2020-07-15 DIAGNOSIS — I889 Nonspecific lymphadenitis, unspecified: Secondary | ICD-10-CM | POA: Diagnosis present

## 2020-07-15 NOTE — Assessment & Plan Note (Signed)
Benign appearing cervical lymphadenitis on right worse than left which is likely related to recent dental work and/or sinus congestion.  Recommended close monitoring but do not suspect it is pathological or related to previous parathyroid disease.  Follow up if not resolving in 4-6 weeks to consider imaging ([place] vs CT) Also provided return precautions if node has significant growth or change.

## 2020-07-15 NOTE — Progress Notes (Signed)
    SUBJECTIVE:   CHIEF COMPLAINT / HPI: neck swelling  R sided submandibular swelling since Thursday/Friday which has fluctuated throughout the weekend and is tender to touch. No similar swelling on the left. Had R parathyroidectomy several years ago and takes 2 tums daily. No swallowing, breathing ,talking interference. No fevers.  Sinus congestion recently from possibly allergies since she was outside a lot recently.  No recent vaccines.  No teeth issues but had dental cleaning on Wednesday. It had been a while since a cleaning prior to that (about 6 years). Denies dental pain or cavities.   OBJECTIVE:   BP 119/80   Pulse 64   Ht 5\' 3"  (1.6 m)   Wt 197 lb 12.8 oz (89.7 kg)   SpO2 97%   BMI 35.04 kg/m   Physical Exam Vitals and nursing note reviewed.  Constitutional:      General: She is not in acute distress.    Appearance: Normal appearance. She is not ill-appearing or toxic-appearing.  HENT:     Head: Normocephalic.     Right Ear: External ear normal.     Left Ear: External ear normal.     Nose: Nose normal.     Mouth/Throat:     Mouth: Mucous membranes are moist.     Pharynx: Oropharynx is clear. No oropharyngeal exudate or posterior oropharyngeal erythema.  Eyes:     Conjunctiva/sclera: Conjunctivae normal.  Neck:     Thyroid: No thyroid mass, thyromegaly or thyroid tenderness.     Trachea: Trachea normal.  Cardiovascular:     Rate and Rhythm: Normal rate.  Musculoskeletal:     Cervical back: Neck supple. No erythema or rigidity.  Lymphadenopathy:     Cervical: Cervical adenopathy (anterior cervical swelling- mildly tender. mobile. right more than left.) present.  Skin:    General: Skin is warm and dry.     Capillary Refill: Capillary refill takes less than 2 seconds.  Neurological:     Mental Status: She is alert.   ASSESSMENT/PLAN:   Cervical lymphadenitis Benign appearing cervical lymphadenitis on right worse than left which is likely related to recent  dental work and/or sinus congestion.  Recommended close monitoring but do not suspect it is pathological or related to previous parathyroid disease.  Follow up if not resolving in 4-6 weeks to consider imaging ([place] vs CT) Also provided return precautions if node has significant growth or change.      [place]

## 2020-07-15 NOTE — Patient Instructions (Signed)
It was a pleasure to see you today!  To summarize our discussion for this visit: The swelling in your neck is likely a lymph node reaction to recent dental work and/or being exposed to a high amount of allergens. I would recommend continuing to watch for now and follow up in 4-6 weeks if not resolving. The exception would be to return sooner if you notice it is growing significantly. Your thyroid gland feels normal today and unrelated to the above swelling.   Some additional health maintenance measures we should update are: Health Maintenance Due  Topic Date Due   Zoster Vaccines- Shingrix (1 of 2) Never done   PNA vac Low Risk Adult (2 of 2 - PCV13) 11/15/2019   COVID-19 Vaccine (3 - Booster for Pfizer series) 02/21/2020     Please return to our clinic to see [place] as needed.  Call the clinic at [phone] if your symptoms worsen or you have any concerns.   Thank you for allowing me to take part in your care,  Dr. Doristine Mango

## 2020-09-09 ENCOUNTER — Other Ambulatory Visit: Payer: Self-pay | Admitting: Family Medicine

## 2020-09-09 DIAGNOSIS — M810 Age-related osteoporosis without current pathological fracture: Secondary | ICD-10-CM

## 2021-02-19 ENCOUNTER — Other Ambulatory Visit: Payer: Self-pay | Admitting: Family Medicine

## 2021-03-05 ENCOUNTER — Ambulatory Visit
Admission: RE | Admit: 2021-03-05 | Discharge: 2021-03-05 | Disposition: A | Discharge status: Home / Self Care | Payer: Medicare HMO | Source: Ambulatory Visit | Attending: Family Medicine | Admitting: Family Medicine

## 2021-03-05 DIAGNOSIS — M810 Age-related osteoporosis without current pathological fracture: Secondary | ICD-10-CM

## 2021-03-05 DIAGNOSIS — M85852 Other specified disorders of bone density and structure, left thigh: Secondary | ICD-10-CM | POA: Diagnosis not present

## 2021-03-05 DIAGNOSIS — Z780 Asymptomatic menopausal state: Secondary | ICD-10-CM | POA: Diagnosis not present

## 2021-05-07 DIAGNOSIS — M810 Age-related osteoporosis without current pathological fracture: Secondary | ICD-10-CM | POA: Diagnosis not present

## 2021-05-07 DIAGNOSIS — Z0000 Encounter for general adult medical examination without abnormal findings: Secondary | ICD-10-CM | POA: Diagnosis not present

## 2021-05-07 DIAGNOSIS — I10 Essential (primary) hypertension: Secondary | ICD-10-CM | POA: Diagnosis not present

## 2021-05-07 DIAGNOSIS — E7800 Pure hypercholesterolemia, unspecified: Secondary | ICD-10-CM | POA: Diagnosis not present

## 2021-05-07 DIAGNOSIS — Z23 Encounter for immunization: Secondary | ICD-10-CM | POA: Diagnosis not present

## 2021-06-14 ENCOUNTER — Other Ambulatory Visit: Payer: Self-pay | Admitting: Family Medicine

## 2021-06-14 DIAGNOSIS — I10 Essential (primary) hypertension: Secondary | ICD-10-CM

## 2021-07-07 ENCOUNTER — Encounter: Payer: Self-pay | Admitting: *Deleted

## 2021-09-02 DIAGNOSIS — L821 Other seborrheic keratosis: Secondary | ICD-10-CM | POA: Diagnosis not present

## 2021-09-02 DIAGNOSIS — I10 Essential (primary) hypertension: Secondary | ICD-10-CM | POA: Diagnosis not present

## 2021-09-02 DIAGNOSIS — E7800 Pure hypercholesterolemia, unspecified: Secondary | ICD-10-CM | POA: Diagnosis not present

## 2021-09-03 ENCOUNTER — Other Ambulatory Visit: Payer: Self-pay | Admitting: Family Medicine

## 2021-09-03 ENCOUNTER — Other Ambulatory Visit (HOSPITAL_BASED_OUTPATIENT_CLINIC_OR_DEPARTMENT_OTHER): Payer: Self-pay | Admitting: Family Medicine

## 2021-09-03 DIAGNOSIS — E7800 Pure hypercholesterolemia, unspecified: Secondary | ICD-10-CM

## 2021-09-18 DIAGNOSIS — D225 Melanocytic nevi of trunk: Secondary | ICD-10-CM | POA: Diagnosis not present

## 2021-09-18 DIAGNOSIS — L820 Inflamed seborrheic keratosis: Secondary | ICD-10-CM | POA: Diagnosis not present

## 2021-09-29 ENCOUNTER — Ambulatory Visit (HOSPITAL_BASED_OUTPATIENT_CLINIC_OR_DEPARTMENT_OTHER)
Admission: RE | Admit: 2021-09-29 | Discharge: 2021-09-29 | Disposition: A | Discharge status: Home / Self Care | Payer: Medicare HMO | Source: Ambulatory Visit | Attending: Family Medicine | Admitting: Family Medicine

## 2021-09-29 DIAGNOSIS — E7800 Pure hypercholesterolemia, unspecified: Secondary | ICD-10-CM | POA: Insufficient documentation

## 2021-11-04 NOTE — Progress Notes (Signed)
Cardiology Office Note:    Date:  11/09/2021   ID:  Maria Wall, DOB 09/26/1950, MRN [id number]  PCP:  Holley Bouche, MD   [facility] Providers Cardiologist:  Lenna Sciara, MD Referring MD: Glenis Smoker, *   Chief Complaint/Reason for Referral: Elevated calcium score  ASSESSMENT:    1. Coronary artery calcification seen on CAT scan   2. Aortic atherosclerosis ([place])   3. Primary hypertension   4. Hyperlipidemia LDL goal <70   5. Nonspecific abnormal electrocardiogram (ECG) (EKG)     PLAN:    In order of problems listed above: 1.  Coronary artery calcification:  Start aspirin 81 mg, increase rosuvastatin to 20 mg, and strict blood pressure control. 2.  Aortic atherosclerosis: See number 1 above. 3.  Hypertension: She did not take her blood pressure medications today.  Her repeat blood pressure today was at goal. 4.  Hyperlipidemia: By virtue of aortic atherosclerosis and coronary calcification her goal LDL is less than 70.  We will check lipid panel LFTs, and LP(a) in 2 months. 5.  Abnormal EKG: We will obtain echocardiogram to evaluate further.        Dispo:  Return in about 9 months (around 08/10/2022).      Medication Adjustments/Labs and Tests Ordered: Current medicines are reviewed at length with the patient today.  Concerns regarding medicines are outlined above.  The following changes have been made:     Labs/tests ordered: Orders Placed This Encounter  Procedures   Lipoprotein A (LPA)   Lipid panel   Hepatic function panel   EKG 12-Lead   ECHOCARDIOGRAM COMPLETE    Medication Changes: Meds ordered this encounter  Medications   aspirin EC 81 MG tablet    Sig: Take 1 tablet (81 mg total) by mouth daily. Swallow whole.    Dispense:  90 tablet    Refill:  3   rosuvastatin (CRESTOR) 20 MG tablet    Sig: Take 1 tablet (20 mg total) by mouth daily.    Dispense:  90 tablet    Refill:  3    Dose increase     Current medicines are  reviewed at length with the patient today.  The patient does not have concerns regarding medicines.   History of Present Illness:    FOCUSED PROBLEM LIST:   Elevated calcium score CT 2023 2.   Hypertension 3.   Aortic atherosclerosis on CT abdomen pelvis 2020  The patient is a 71 y.o. female with the indicated medical history here for recommendations regarding elevated calcium score.  Patient was seen recently her primary care provider.  She was referred for a calcium score for risk stratification.  This demonstrated calcification of the left circumflex and right coronary arteries.  She is referred for further recommendations.  The patient is doing well.  She is retired from [employer].  She exercises on a regular basis at the gym and walks without exertional angina or dyspnea.  She does get lightheaded when she goes from sitting to standing quickly.  She denies any palpitations, paroxysmal nocturnal dyspnea, orthopnea.  She had no signs or symptoms of stroke.  She has not required any emergency room visits or hospitalizations recently.  She forgot to take her amlodipine this morning.        Current Medications: Current Meds  Medication Sig   alendronate (FOSAMAX) 70 MG tablet TAKE 1 TABLET (70 MG) BY MOUTH EVERY 7 DAYS. TAKE WITH A FULL GLASS OF WATER ON AN EMPTY  STOMACH.   amLODipine (NORVASC) 5 MG tablet TAKE 1 TABLET (5 MG TOTAL) BY MOUTH DAILY.   Ascorbic Acid (VITAMIN C) 1000 MG tablet Take 1,000 mg by mouth daily.    aspirin EC 81 MG tablet Take 1 tablet (81 mg total) by mouth daily. Swallow whole.   Biotin w/ Vitamins C & E (HAIR/SKIN/NAILS PO) Take 1 tablet by mouth daily.   calcium carbonate (TUMS - DOSED IN MG ELEMENTAL CALCIUM) 500 MG chewable tablet Chew 2 tablets by mouth daily.   cholecalciferol (VITAMIN D) 25 MCG (1000 UNIT) tablet Take 1,000 Units by mouth daily.    ELDERBERRY PO Take 50 mg by mouth daily.    hydrocortisone 2.5 % cream Apply 1 application topically daily  as needed (itching).   Lysine 1000 MG TABS Take 1,000 mg by mouth daily.   magnesium oxide (MAG-OX) 400 MG tablet Take 400 mg by mouth daily.   Multiple Vitamins-Minerals (MULTIVITAMIN ADULT PO) Take 1 tablet by mouth daily.   polycarbophil (FIBERCON) 625 MG tablet Take 625 mg by mouth daily.   rosuvastatin (CRESTOR) 20 MG tablet Take 1 tablet (20 mg total) by mouth daily.   Turmeric 500 MG TABS Take 500 mg by mouth daily.   vitamin B-12 (CYANOCOBALAMIN) 1000 MCG tablet Take 1,000 mcg by mouth daily.   [DISCONTINUED] rosuvastatin (CRESTOR) 10 MG tablet Take 10 mg by mouth daily.     Allergies:    Patient has no known allergies.   Social History:   Social History   Tobacco Use   Smoking status: Never   Smokeless tobacco: Never  Vaping Use   Vaping Use: Never used  Substance Use Topics   Alcohol use: No   Drug use: Never     Family Hx: Family History  Problem Relation Age of Onset   Hypertension Mother    Osteoporosis Mother    Stroke Mother    Alzheimer's disease Father    Heart disease Father    Hypertension Father    Asthma Brother    Depression Maternal Grandfather    Breast cancer Neg Hx    Colon cancer Neg Hx    Esophageal cancer Neg Hx    Rectal cancer Neg Hx    Stomach cancer Neg Hx      Review of Systems:   Please see the history of present illness.    All other systems reviewed and are negative.     EKGs/Labs/Other Test Reviewed:    EKG:  EKG performed 2020 that I personally reviewed demonstrates sinus rhythm; EKG performed today that I personally reviewed demonstrates sinus rhythm with septal infarction pattern.  Prior CV studies:  Calcium score CT 2023: Coronary calcium score of 143. This was 10 percentile for age-, race-, and sex-matched controls.  Other studies Reviewed: Review of the additional studies/records demonstrates: CT abdomen pelvis 2020 with aortic atherosclerosis  Recent Labs: No results found for requested labs within last 365  days.   Recent Lipid Panel Lab Results  Component Value Date/Time   CHOL 190 09/30/2017 02:27 PM   TRIG 102 09/30/2017 02:27 PM   HDL 67 09/30/2017 02:27 PM   LDLCALC 103 (H) 09/30/2017 02:27 PM    Risk Assessment/Calculations:                Physical Exam:    VS:  BP 138/75   Pulse 62   Ht '5\' 3"'$  (1.6 m)   Wt 199 lb 3.2 oz (90.4 kg)   SpO2 98%  BMI 35.29 kg/m    Wt Readings from Last 3 Encounters:  11/09/21 199 lb 3.2 oz (90.4 kg)  07/15/20 197 lb 12.8 oz (89.7 kg)  04/10/20 194 lb (88 kg)    GENERAL:  No apparent distress, AOx3 HEENT:  No carotid bruits, +2 carotid impulses, no scleral icterus CAR: RRR no murmurs, gallops, rubs, or thrills RES:  Clear to auscultation bilaterally ABD:  Soft, nontender, nondistended, positive bowel sounds x 4 VASC:  +2 radial pulses, +2 carotid pulses, palpable pedal pulses NEURO:  CN 2-12 grossly intact; motor and sensory grossly intact PSYCH:  No active depression or anxiety EXT:  No edema, ecchymosis, or cyanosis  Signed, Early Osmond, MD  11/09/2021 [facility] [address], [place], [place]  [address] Phone: [phone]; Fax: [phone]   Note:  This document was prepared using Dragon voice recognition software and may include unintentional dictation errors.

## 2021-11-09 ENCOUNTER — Encounter: Payer: Self-pay | Admitting: Internal Medicine

## 2021-11-09 ENCOUNTER — Ambulatory Visit: Payer: Medicare HMO | Attending: Internal Medicine | Admitting: Internal Medicine

## 2021-11-09 VITALS — BP 138/75 | HR 62 | Ht 63.0 in | Wt 199.2 lb

## 2021-11-09 DIAGNOSIS — I10 Essential (primary) hypertension: Secondary | ICD-10-CM

## 2021-11-09 DIAGNOSIS — I2510 Atherosclerotic heart disease of native coronary artery without angina pectoris: Secondary | ICD-10-CM

## 2021-11-09 DIAGNOSIS — R9431 Abnormal electrocardiogram [ECG] [EKG]: Secondary | ICD-10-CM | POA: Diagnosis not present

## 2021-11-09 DIAGNOSIS — I700 Atherosclerosis of aorta: Secondary | ICD-10-CM

## 2021-11-09 DIAGNOSIS — E785 Hyperlipidemia, unspecified: Secondary | ICD-10-CM

## 2021-11-09 MED ORDER — ASPIRIN 81 MG PO TBEC: 81.0000 mg | DELAYED_RELEASE_TABLET | Freq: Every day | ORAL | 3 refills | Status: AC

## 2021-11-09 MED ORDER — ROSUVASTATIN CALCIUM 20 MG PO TABS: 20.0000 mg | ORAL_TABLET | Freq: Every day | ORAL | 3 refills | Status: DC

## 2021-11-09 NOTE — Patient Instructions (Signed)
Medication Instructions:  Your physician has recommended you make the following change in your medication:  1.) start aspirin 81 mg daily 2.) increase rosuvastatin (Crestor) to 20 mg daily  *If you need a refill on your cardiac medications before your next appointment, please call your pharmacy*   Lab Work: Please return in 2 months (lipids, liver, Lp(a)  If you have labs (blood work) drawn today and your tests are completely normal, you will receive your results only by: [place] (if you have MyChart) OR A paper copy in the mail If you have any lab test that is abnormal or we need to change your treatment, we will call you to review the results.   Testing/Procedures: Your physician has requested that you have an echocardiogram. Echocardiography is a painless test that uses sound waves to create images of your heart. It provides your doctor with information about the size and shape of your heart and how well your heart's chambers and valves are working. This procedure takes approximately one hour. There are no restrictions for this procedure.   Follow-Up: At [facility], you and your health needs are our priority.  As part of our continuing mission to provide you with exceptional heart care, we have created designated Provider Care Teams.  These Care Teams include your primary Cardiologist (physician) and Advanced Practice Providers (APPs -  Physician Assistants and Nurse Practitioners) who all work together to provide you with the care you need, when you need it.  We recommend signing up for the patient portal called "MyChart".  Sign up information is provided on this After Visit Summary.  MyChart is used to connect with patients for Virtual Visits (Telemedicine).  Patients are able to view lab/test results, encounter notes, upcoming appointments, etc.  Non-urgent messages can be sent to your provider as well.   To learn more about what you can do with MyChart, go to  [email or web address].    Your next appointment:   9 month(s)  The format for your next appointment:   In Person  Provider:   Advanced Practice Practitioner (NP, PA)   Important Information About Sugar

## 2021-11-18 ENCOUNTER — Ambulatory Visit: Payer: Medicare HMO | Attending: Cardiology

## 2021-11-18 DIAGNOSIS — I700 Atherosclerosis of aorta: Secondary | ICD-10-CM | POA: Diagnosis not present

## 2021-11-18 DIAGNOSIS — I2510 Atherosclerotic heart disease of native coronary artery without angina pectoris: Secondary | ICD-10-CM

## 2021-11-18 DIAGNOSIS — R9431 Abnormal electrocardiogram [ECG] [EKG]: Secondary | ICD-10-CM | POA: Diagnosis not present

## 2021-11-18 DIAGNOSIS — E785 Hyperlipidemia, unspecified: Secondary | ICD-10-CM | POA: Diagnosis not present

## 2021-11-18 LAB — ECHOCARDIOGRAM COMPLETE
AR max vel: 2.13 cm2
AV Area VTI: 2.28 cm2
AV Area mean vel: 1.97 cm2
AV Mean grad: 5 mmHg
AV Peak grad: 9.6 mmHg
Ao pk vel: 1.55 m/s
Area-P 1/2: 3 cm2
S' Lateral: 2.7 cm

## 2021-12-11 DIAGNOSIS — D2239 Melanocytic nevi of other parts of face: Secondary | ICD-10-CM | POA: Diagnosis not present

## 2021-12-11 DIAGNOSIS — L570 Actinic keratosis: Secondary | ICD-10-CM | POA: Diagnosis not present

## 2021-12-11 DIAGNOSIS — D485 Neoplasm of uncertain behavior of skin: Secondary | ICD-10-CM | POA: Diagnosis not present

## 2021-12-11 DIAGNOSIS — L739 Follicular disorder, unspecified: Secondary | ICD-10-CM | POA: Diagnosis not present

## 2021-12-11 DIAGNOSIS — L820 Inflamed seborrheic keratosis: Secondary | ICD-10-CM | POA: Diagnosis not present

## 2021-12-11 DIAGNOSIS — D225 Melanocytic nevi of trunk: Secondary | ICD-10-CM | POA: Diagnosis not present

## 2022-01-01 ENCOUNTER — Telehealth: Payer: Self-pay | Admitting: Internal Medicine

## 2022-01-01 DIAGNOSIS — I2510 Atherosclerotic heart disease of native coronary artery without angina pectoris: Secondary | ICD-10-CM

## 2022-01-01 DIAGNOSIS — I700 Atherosclerosis of aorta: Secondary | ICD-10-CM

## 2022-01-01 DIAGNOSIS — E785 Hyperlipidemia, unspecified: Secondary | ICD-10-CM

## 2022-01-01 NOTE — Addendum Note (Signed)
Addended by: Rodman Key on: 01/01/2022 12:21 PM   Modules accepted: Orders

## 2022-01-01 NOTE — Telephone Encounter (Signed)
Called and spoke with the patient.  She came to the [facility] in [place] this am but not up to our office for the blood work.  Her lab appointment here was scheduled for 12/122/23 but she will be out of town.  I edited her lab orders and released them.  She is planning to go to the [place] office next week.

## 2022-01-01 NOTE — Telephone Encounter (Signed)
Patient stated she will be going out of town and would like to have her labs drawn at the [facility].  Patient requests a call back when the lab order is available at the [facility] lab.

## 2022-01-04 DIAGNOSIS — I2510 Atherosclerotic heart disease of native coronary artery without angina pectoris: Secondary | ICD-10-CM | POA: Diagnosis not present

## 2022-01-04 DIAGNOSIS — E785 Hyperlipidemia, unspecified: Secondary | ICD-10-CM | POA: Diagnosis not present

## 2022-01-04 DIAGNOSIS — I700 Atherosclerosis of aorta: Secondary | ICD-10-CM | POA: Diagnosis not present

## 2022-01-05 LAB — LIPOPROTEIN A (LPA): Lipoprotein (a): 152.3 nmol/L — ABNORMAL HIGH (ref <75.0)

## 2022-01-05 LAB — LIPID PANEL
Chol/HDL Ratio: 2.3 ratio (ref 0.0–4.4)
Cholesterol, Total: 134 mg/dL (ref 100–199)
HDL: 58 mg/dL (ref >39)
LDL Chol Calc (NIH): 57 mg/dL (ref 0–99)
Triglycerides: 106 mg/dL (ref 0–149)
VLDL Cholesterol Cal: 19 mg/dL (ref 5–40)

## 2022-01-05 LAB — HEPATIC FUNCTION PANEL
ALT: 18 IU/L (ref 0–32)
AST: 24 IU/L (ref 0–40)
Albumin: 4.4 g/dL (ref 3.8–4.8)
Alkaline Phosphatase: 56 IU/L (ref 44–121)
Bilirubin Total: 0.4 mg/dL (ref 0.0–1.2)
Bilirubin, Direct: 0.14 mg/dL (ref 0.00–0.40)
Total Protein: 6.8 g/dL (ref 6.0–8.5)

## 2022-01-06 ENCOUNTER — Telehealth: Payer: Self-pay | Admitting: *Deleted

## 2022-01-06 DIAGNOSIS — E7841 Elevated Lipoprotein(a): Secondary | ICD-10-CM

## 2022-01-06 NOTE — Telephone Encounter (Signed)
-----   Message from Early Osmond, MD sent at 01/06/2022  6:52 AM EST ----- Please refer to [facility] re Lp(a)

## 2022-01-06 NOTE — Telephone Encounter (Signed)
Referral placed to Dr. Debara Pickett.  Pt notified via [place].

## 2022-01-11 ENCOUNTER — Other Ambulatory Visit: Payer: Medicare HMO

## 2022-02-08 ENCOUNTER — Encounter: Payer: Self-pay | Admitting: Internal Medicine

## 2022-02-08 DIAGNOSIS — E7841 Elevated Lipoprotein(a): Secondary | ICD-10-CM

## 2022-03-18 NOTE — Progress Notes (Signed)
Patient ID: Maria Wall                 DOB: May 10, 1950                    MRN: [id number]     HPI: Maria Wall is a 72 y.o. female patient referred to lipid clinic by Dr Ali Lowe. PMH is significant for aortic atherosclerosis seen on abdominal CT in 2020, HTN, HLD, and elevated Lp(a). Calcium score 09/2021 was elevated at 143 (75th percentile for age, race and sex matched controls).  Pt presents today for follow up. Reports tolerating rosuvastatin well. Her mom had a stroke in her 53s. Currently has [employer] Part D insurance, no supplement. Previously reports weighing 280 lbs, dropped down to 125, now closer to 200. Working on [employer] back to higher intake of vegetables and protein.  Current Medications: rosuvastatin 80m daily Risk Factors: elevated calcium score, FHx CAD, elevated Lp(a) LDL goal: <746mdL  Diet: vegetables and protein  Exercise: goes to the gym regularly and walks 3 days a week.  Family History: Mother with HTN and stroke in her 8075sfather with CHF, Alzheimers, and HTN.  Social History: No tobacco, alcohol or drug use.  Labs: 01/04/22: TC 134, TG 106, HDL 58, LDL 57, Lp(a) 152.3 - on rosuvastatin 2066maily  Past Medical History:  Diagnosis Date   Acute appendicitis 07/07/2018   Allergy    Anemia    Coronary atherosclerosis due to calcified coronary lesion    GERD (gastroesophageal reflux disease)    Hypercalcemia 11/15/2018   Being worked up by Dr. GerEbony Cargodocrinology 11/2018   Hyperlipidemia    Hypertension    Left ureteral stone    Osteoporosis 02/18/2019   Thyroid disease    Wears glasses     Current Outpatient Medications on File Prior to Visit  Medication Sig Dispense Refill   alendronate (FOSAMAX) 70 MG tablet TAKE 1 TABLET (70 MG) BY MOUTH EVERY 7 DAYS. TAKE WITH A FULL GLASS OF WATER ON AN EMPTY STOMACH. 12 tablet 3   amLODipine (NORVASC) 5 MG tablet TAKE 1 TABLET (5 MG TOTAL) BY MOUTH DAILY. 90 tablet 3   Ascorbic Acid (VITAMIN C) 1000  MG tablet Take 1,000 mg by mouth daily.      aspirin EC 81 MG tablet Take 1 tablet (81 mg total) by mouth daily. Swallow whole. 90 tablet 3   Biotin w/ Vitamins C & E (HAIR/SKIN/NAILS PO) Take 1 tablet by mouth daily.     calcium carbonate (TUMS - DOSED IN MG ELEMENTAL CALCIUM) 500 MG chewable tablet Chew 2 tablets by mouth daily.     cholecalciferol (VITAMIN D) 25 MCG (1000 UNIT) tablet Take 1,000 Units by mouth daily.      ELDERBERRY PO Take 50 mg by mouth daily.      hydrocortisone 2.5 % cream Apply 1 application topically daily as needed (itching).     Lysine 1000 MG TABS Take 1,000 mg by mouth daily.     magnesium oxide (MAG-OX) 400 MG tablet Take 400 mg by mouth daily.     Multiple Vitamins-Minerals (MULTIVITAMIN ADULT PO) Take 1 tablet by mouth daily.     polycarbophil (FIBERCON) 625 MG tablet Take 625 mg by mouth daily.     rosuvastatin (CRESTOR) 20 MG tablet Take 1 tablet (20 mg total) by mouth daily. 90 tablet 3   Turmeric 500 MG TABS Take 500 mg by mouth daily.     vitamin  B-12 (CYANOCOBALAMIN) 1000 MCG tablet Take 1,000 mcg by mouth daily.     No current facility-administered medications on file prior to visit.    No Known Allergies  Assessment/Plan:  1. Hyperlipidemia - LDL well controlled at 57 at goal < 70 given history of elevated calcium score. Her Lp(a) is moderately elevated. Discussed that there are no FDA approved medications currently to lower this, and that our emphasis is to keep her cholesterol under good control to prevent future MI or stroke. Discussed that PCSK9i and Leqvio can lower Lp(a) by 20-25%, however since her LDL is controlled at 57, insurance not likely to cover Repatha. Her current insurance does not cover Leqvio well. [place] (a) trial has already completed enrollment, may have medication option to lower Lp(a) in the next few years. Pt will continue on her rosuvastatin 58m daily, heart healthy diet, and exercise at the gym 3x per week.  Therasa Lorenzi E. Arta Stump,  PharmD, BCACP, [place] [address] [place] [address]Phone: [phone] Fax: [phone]/16/2024 9:05 AM

## 2022-03-19 ENCOUNTER — Ambulatory Visit: Payer: Medicare HMO | Attending: Interventional Cardiology | Admitting: Pharmacist

## 2022-03-19 DIAGNOSIS — E7841 Elevated Lipoprotein(a): Secondary | ICD-10-CM | POA: Diagnosis not present

## 2022-03-19 DIAGNOSIS — R931 Abnormal findings on diagnostic imaging of heart and coronary circulation: Secondary | ICD-10-CM

## 2022-03-19 NOTE — Patient Instructions (Addendum)
Your LDL (bad) cholesterol is 57 and at goal < 70 because of your elevated calcium score  Your Lipoprotein a is 152, normal is < 75. This is an independent risk factor for heart disease. When it's elevated, we like to keep good control your cholesterol.  Continue taking your rosuvastatin 44m daily, eating a heart healthy diet and staying active  Repatha and Leqvio are injections approved to lower LDL cholesterol by 50-60% and do also lower lipoprotein by 20-25%. Repatha is an injection given by yourself every 2 weeks (cost is about $45/month if insurance approves it) and LMarion Downeris an injection given at the hospital 2-3x a year (would need a Medicare supplement plan in the future for this to be covered in full)

## 2022-05-20 DIAGNOSIS — Z23 Encounter for immunization: Secondary | ICD-10-CM | POA: Diagnosis not present

## 2022-05-20 DIAGNOSIS — E7800 Pure hypercholesterolemia, unspecified: Secondary | ICD-10-CM | POA: Diagnosis not present

## 2022-05-20 DIAGNOSIS — I10 Essential (primary) hypertension: Secondary | ICD-10-CM | POA: Diagnosis not present

## 2022-05-20 DIAGNOSIS — E892 Postprocedural hypoparathyroidism: Secondary | ICD-10-CM | POA: Diagnosis not present

## 2022-05-20 DIAGNOSIS — Z79899 Other long term (current) drug therapy: Secondary | ICD-10-CM | POA: Diagnosis not present

## 2022-05-20 DIAGNOSIS — M810 Age-related osteoporosis without current pathological fracture: Secondary | ICD-10-CM | POA: Diagnosis not present

## 2022-05-20 DIAGNOSIS — I700 Atherosclerosis of aorta: Secondary | ICD-10-CM | POA: Diagnosis not present

## 2022-05-20 DIAGNOSIS — Z0000 Encounter for general adult medical examination without abnormal findings: Secondary | ICD-10-CM | POA: Diagnosis not present

## 2022-06-03 ENCOUNTER — Other Ambulatory Visit: Payer: Self-pay | Admitting: Family Medicine

## 2022-06-03 DIAGNOSIS — I10 Essential (primary) hypertension: Secondary | ICD-10-CM

## 2022-06-04 ENCOUNTER — Telehealth: Payer: Self-pay

## 2022-06-04 NOTE — Telephone Encounter (Signed)
-----   Message from Bess Kinds, MD sent at 06/03/2022 10:52 AM EDT ----- Regarding: Check up Hey team!  Can we get this patient scheduled for a check up? It looks like she hasn't been seen in a while.   Let me know if you need anything from me!  -BS

## 2022-06-04 NOTE — Telephone Encounter (Signed)
Called patient to schedule an appointment, she told me her and her husband are currently seeing a new doctor over at [facility]. Penni Bombard CMA

## 2022-10-31 ENCOUNTER — Other Ambulatory Visit: Payer: Self-pay | Admitting: Internal Medicine

## 2022-11-26 ENCOUNTER — Other Ambulatory Visit: Payer: Self-pay | Admitting: Student

## 2022-11-26 DIAGNOSIS — E892 Postprocedural hypoparathyroidism: Secondary | ICD-10-CM | POA: Diagnosis not present

## 2022-11-26 DIAGNOSIS — I10 Essential (primary) hypertension: Secondary | ICD-10-CM | POA: Diagnosis not present

## 2022-11-26 DIAGNOSIS — I2584 Coronary atherosclerosis due to calcified coronary lesion: Secondary | ICD-10-CM | POA: Diagnosis not present

## 2022-11-26 DIAGNOSIS — M810 Age-related osteoporosis without current pathological fracture: Secondary | ICD-10-CM | POA: Diagnosis not present

## 2022-11-26 DIAGNOSIS — Z23 Encounter for immunization: Secondary | ICD-10-CM | POA: Diagnosis not present

## 2022-11-26 DIAGNOSIS — M25562 Pain in left knee: Secondary | ICD-10-CM | POA: Diagnosis not present

## 2022-11-29 ENCOUNTER — Other Ambulatory Visit: Payer: Self-pay | Admitting: Family Medicine

## 2022-11-29 DIAGNOSIS — Z1231 Encounter for screening mammogram for malignant neoplasm of breast: Secondary | ICD-10-CM

## 2022-12-03 DIAGNOSIS — D225 Melanocytic nevi of trunk: Secondary | ICD-10-CM | POA: Diagnosis not present

## 2022-12-03 DIAGNOSIS — D485 Neoplasm of uncertain behavior of skin: Secondary | ICD-10-CM | POA: Diagnosis not present

## 2022-12-03 DIAGNOSIS — L814 Other melanin hyperpigmentation: Secondary | ICD-10-CM | POA: Diagnosis not present

## 2022-12-03 DIAGNOSIS — L821 Other seborrheic keratosis: Secondary | ICD-10-CM | POA: Diagnosis not present

## 2022-12-03 DIAGNOSIS — D2239 Melanocytic nevi of other parts of face: Secondary | ICD-10-CM | POA: Diagnosis not present

## 2022-12-21 DIAGNOSIS — H353131 Nonexudative age-related macular degeneration, bilateral, early dry stage: Secondary | ICD-10-CM | POA: Diagnosis not present

## 2022-12-21 DIAGNOSIS — Z01818 Encounter for other preprocedural examination: Secondary | ICD-10-CM | POA: Diagnosis not present

## 2022-12-21 DIAGNOSIS — H25812 Combined forms of age-related cataract, left eye: Secondary | ICD-10-CM | POA: Diagnosis not present

## 2022-12-21 DIAGNOSIS — H25813 Combined forms of age-related cataract, bilateral: Secondary | ICD-10-CM | POA: Diagnosis not present

## 2023-01-06 ENCOUNTER — Ambulatory Visit: Payer: Medicare HMO

## 2023-01-10 DIAGNOSIS — H2512 Age-related nuclear cataract, left eye: Secondary | ICD-10-CM | POA: Diagnosis not present

## 2023-01-10 DIAGNOSIS — H25812 Combined forms of age-related cataract, left eye: Secondary | ICD-10-CM | POA: Diagnosis not present

## 2023-01-10 DIAGNOSIS — H269 Unspecified cataract: Secondary | ICD-10-CM | POA: Diagnosis not present

## 2023-01-10 DIAGNOSIS — H2181 Floppy iris syndrome: Secondary | ICD-10-CM | POA: Diagnosis not present

## 2023-01-11 ENCOUNTER — Ambulatory Visit
Admission: RE | Admit: 2023-01-11 | Discharge: 2023-01-11 | Disposition: A | Discharge status: Home / Self Care | Payer: Medicare HMO | Source: Ambulatory Visit | Attending: Family Medicine | Admitting: Family Medicine

## 2023-01-11 DIAGNOSIS — Z1231 Encounter for screening mammogram for malignant neoplasm of breast: Secondary | ICD-10-CM

## 2023-02-15 DIAGNOSIS — H52223 Regular astigmatism, bilateral: Secondary | ICD-10-CM | POA: Diagnosis not present

## 2023-02-15 DIAGNOSIS — H25811 Combined forms of age-related cataract, right eye: Secondary | ICD-10-CM | POA: Diagnosis not present

## 2023-02-15 DIAGNOSIS — H353131 Nonexudative age-related macular degeneration, bilateral, early dry stage: Secondary | ICD-10-CM | POA: Diagnosis not present

## 2023-02-15 DIAGNOSIS — H259 Unspecified age-related cataract: Secondary | ICD-10-CM | POA: Diagnosis not present

## 2023-02-15 DIAGNOSIS — H18513 Endothelial corneal dystrophy, bilateral: Secondary | ICD-10-CM | POA: Diagnosis not present

## 2023-02-19 ENCOUNTER — Other Ambulatory Visit: Payer: Self-pay | Admitting: Student

## 2023-02-19 DIAGNOSIS — I10 Essential (primary) hypertension: Secondary | ICD-10-CM

## 2023-06-02 DIAGNOSIS — Z23 Encounter for immunization: Secondary | ICD-10-CM | POA: Diagnosis not present

## 2023-06-02 DIAGNOSIS — Z0000 Encounter for general adult medical examination without abnormal findings: Secondary | ICD-10-CM | POA: Diagnosis not present

## 2023-06-02 DIAGNOSIS — Z79899 Other long term (current) drug therapy: Secondary | ICD-10-CM | POA: Diagnosis not present

## 2023-06-02 DIAGNOSIS — E7800 Pure hypercholesterolemia, unspecified: Secondary | ICD-10-CM | POA: Diagnosis not present

## 2023-06-02 DIAGNOSIS — I10 Essential (primary) hypertension: Secondary | ICD-10-CM | POA: Diagnosis not present

## 2023-06-02 DIAGNOSIS — I700 Atherosclerosis of aorta: Secondary | ICD-10-CM | POA: Diagnosis not present

## 2023-06-02 DIAGNOSIS — M810 Age-related osteoporosis without current pathological fracture: Secondary | ICD-10-CM | POA: Diagnosis not present

## 2023-06-02 DIAGNOSIS — I059 Rheumatic mitral valve disease, unspecified: Secondary | ICD-10-CM | POA: Diagnosis not present

## 2023-09-11 DIAGNOSIS — Z7982 Long term (current) use of aspirin: Secondary | ICD-10-CM | POA: Diagnosis not present

## 2023-09-11 DIAGNOSIS — I10 Essential (primary) hypertension: Secondary | ICD-10-CM | POA: Diagnosis not present

## 2023-09-11 DIAGNOSIS — Z7902 Long term (current) use of antithrombotics/antiplatelets: Secondary | ICD-10-CM | POA: Diagnosis not present

## 2023-09-11 DIAGNOSIS — R270 Ataxia, unspecified: Secondary | ICD-10-CM | POA: Diagnosis not present

## 2023-09-11 DIAGNOSIS — G459 Transient cerebral ischemic attack, unspecified: Secondary | ICD-10-CM | POA: Diagnosis not present

## 2023-09-11 DIAGNOSIS — R42 Dizziness and giddiness: Secondary | ICD-10-CM | POA: Diagnosis not present

## 2023-09-11 DIAGNOSIS — R55 Syncope and collapse: Secondary | ICD-10-CM | POA: Diagnosis not present

## 2023-09-11 DIAGNOSIS — Z6833 Body mass index (BMI) 33.0-33.9, adult: Secondary | ICD-10-CM | POA: Diagnosis not present

## 2023-09-11 DIAGNOSIS — E66811 Obesity, class 1: Secondary | ICD-10-CM | POA: Diagnosis not present

## 2023-09-11 DIAGNOSIS — R29818 Other symptoms and signs involving the nervous system: Secondary | ICD-10-CM | POA: Diagnosis not present

## 2023-09-11 DIAGNOSIS — Z79899 Other long term (current) drug therapy: Secondary | ICD-10-CM | POA: Diagnosis not present

## 2023-09-11 DIAGNOSIS — E785 Hyperlipidemia, unspecified: Secondary | ICD-10-CM | POA: Diagnosis not present

## 2023-09-12 DIAGNOSIS — G459 Transient cerebral ischemic attack, unspecified: Secondary | ICD-10-CM | POA: Diagnosis not present

## 2023-09-12 DIAGNOSIS — R42 Dizziness and giddiness: Secondary | ICD-10-CM | POA: Diagnosis not present

## 2023-09-12 DIAGNOSIS — I10 Essential (primary) hypertension: Secondary | ICD-10-CM | POA: Diagnosis not present

## 2023-09-13 DIAGNOSIS — R42 Dizziness and giddiness: Secondary | ICD-10-CM | POA: Diagnosis not present

## 2023-09-19 DIAGNOSIS — Z7982 Long term (current) use of aspirin: Secondary | ICD-10-CM | POA: Diagnosis not present

## 2023-09-19 DIAGNOSIS — Z7902 Long term (current) use of antithrombotics/antiplatelets: Secondary | ICD-10-CM | POA: Diagnosis not present

## 2023-09-19 DIAGNOSIS — R079 Chest pain, unspecified: Secondary | ICD-10-CM | POA: Diagnosis not present

## 2023-09-19 DIAGNOSIS — Z79899 Other long term (current) drug therapy: Secondary | ICD-10-CM | POA: Diagnosis not present

## 2023-09-19 DIAGNOSIS — I10 Essential (primary) hypertension: Secondary | ICD-10-CM | POA: Diagnosis not present

## 2023-09-20 DIAGNOSIS — R9431 Abnormal electrocardiogram [ECG] [EKG]: Secondary | ICD-10-CM | POA: Diagnosis not present

## 2023-09-20 DIAGNOSIS — I10 Essential (primary) hypertension: Secondary | ICD-10-CM | POA: Diagnosis not present

## 2023-09-21 DIAGNOSIS — R42 Dizziness and giddiness: Secondary | ICD-10-CM | POA: Diagnosis not present

## 2023-09-21 DIAGNOSIS — I10 Essential (primary) hypertension: Secondary | ICD-10-CM | POA: Diagnosis not present

## 2023-10-24 ENCOUNTER — Other Ambulatory Visit: Payer: Self-pay

## 2023-10-24 MED ORDER — ROSUVASTATIN CALCIUM 20 MG PO TABS: 20.0000 mg | ORAL_TABLET | Freq: Every day | ORAL | 0 refills | Status: AC

## 2023-12-05 DIAGNOSIS — R6889 Other general symptoms and signs: Secondary | ICD-10-CM | POA: Diagnosis not present

## 2023-12-05 DIAGNOSIS — Z820 Family history of epilepsy and other diseases of the nervous system: Secondary | ICD-10-CM | POA: Diagnosis not present

## 2023-12-05 DIAGNOSIS — I10 Essential (primary) hypertension: Secondary | ICD-10-CM | POA: Diagnosis not present

## 2023-12-05 DIAGNOSIS — M810 Age-related osteoporosis without current pathological fracture: Secondary | ICD-10-CM | POA: Diagnosis not present

## 2023-12-05 DIAGNOSIS — E7800 Pure hypercholesterolemia, unspecified: Secondary | ICD-10-CM | POA: Diagnosis not present

## 2023-12-09 DIAGNOSIS — L578 Other skin changes due to chronic exposure to nonionizing radiation: Secondary | ICD-10-CM | POA: Diagnosis not present

## 2023-12-09 DIAGNOSIS — D225 Melanocytic nevi of trunk: Secondary | ICD-10-CM | POA: Diagnosis not present

## 2023-12-09 DIAGNOSIS — D2239 Melanocytic nevi of other parts of face: Secondary | ICD-10-CM | POA: Diagnosis not present

## 2023-12-09 DIAGNOSIS — D485 Neoplasm of uncertain behavior of skin: Secondary | ICD-10-CM | POA: Diagnosis not present

## 2023-12-09 DIAGNOSIS — L814 Other melanin hyperpigmentation: Secondary | ICD-10-CM | POA: Diagnosis not present
